# Patient Record
Sex: Female | Born: 1960
Health system: Southern US, Community
[De-identification: ages and names within clinical notes are randomized; demographics above are authoritative.]

## PROBLEM LIST (undated history)

## (undated) DIAGNOSIS — R12 Heartburn: Secondary | ICD-10-CM

## (undated) DIAGNOSIS — Z87440 Personal history of urinary (tract) infections: Secondary | ICD-10-CM

## (undated) DIAGNOSIS — I10 Essential (primary) hypertension: Secondary | ICD-10-CM

## (undated) DIAGNOSIS — M549 Dorsalgia, unspecified: Secondary | ICD-10-CM

## (undated) DIAGNOSIS — B019 Varicella without complication: Secondary | ICD-10-CM

## (undated) HISTORY — DX: Varicella without complication: B01.9

## (undated) HISTORY — DX: Essential (primary) hypertension: I10

## (undated) HISTORY — PX: TRANSVAGINAL TAPE (TVT) REMOVAL: SHX6154

## (undated) HISTORY — DX: Heartburn: R12

## (undated) HISTORY — DX: Dorsalgia, unspecified: M54.9

## (undated) HISTORY — DX: Personal history of urinary (tract) infections: Z87.440

## (undated) HISTORY — PX: CARPAL TUNNEL RELEASE: SHX101

---
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
## 1984-01-10 HISTORY — PX: TONSILLECTOMY: SUR1361

## 2012-05-17 LAB — HM COLONOSCOPY

## 2013-04-03 DIAGNOSIS — N952 Postmenopausal atrophic vaginitis: Secondary | ICD-10-CM | POA: Insufficient documentation

## 2013-04-03 DIAGNOSIS — E669 Obesity, unspecified: Secondary | ICD-10-CM | POA: Insufficient documentation

## 2015-05-24 DIAGNOSIS — B399 Histoplasmosis, unspecified: Secondary | ICD-10-CM | POA: Diagnosis not present

## 2015-06-28 ENCOUNTER — Ambulatory Visit (INDEPENDENT_AMBULATORY_CARE_PROVIDER_SITE_OTHER): Payer: BLUE CROSS/BLUE SHIELD | Admitting: Family Medicine

## 2015-06-28 ENCOUNTER — Encounter: Payer: Self-pay | Admitting: Family Medicine

## 2015-06-28 VITALS — BP 122/86 | HR 63 | Temp 98.2°F | Ht 66.5 in | Wt 232.8 lb

## 2015-06-28 DIAGNOSIS — R300 Dysuria: Secondary | ICD-10-CM | POA: Diagnosis not present

## 2015-06-28 DIAGNOSIS — N3001 Acute cystitis with hematuria: Secondary | ICD-10-CM | POA: Diagnosis not present

## 2015-06-28 DIAGNOSIS — R21 Rash and other nonspecific skin eruption: Secondary | ICD-10-CM | POA: Insufficient documentation

## 2015-06-28 DIAGNOSIS — N390 Urinary tract infection, site not specified: Secondary | ICD-10-CM | POA: Insufficient documentation

## 2015-06-28 LAB — POCT URINALYSIS DIPSTICK
Bilirubin, UA: NEGATIVE
Glucose, UA: NEGATIVE
Ketones, UA: NEGATIVE
Protein, UA: NEGATIVE
Spec Grav, UA: 1.02
Urobilinogen, UA: 0.2
pH, UA: 7

## 2015-06-28 MED ORDER — PREDNISONE 10 MG PO TABS: ORAL_TABLET | ORAL | Status: DC

## 2015-06-28 MED ORDER — CEPHALEXIN 500 MG PO CAPS: 500.0000 mg | ORAL_CAPSULE | Freq: Two times a day (BID) | ORAL | Status: DC

## 2015-06-28 NOTE — Patient Instructions (Signed)
Nice to meet you. You have a UTI. We will treat you with Keflex. We'll send your urine for a culture to ensure that there is infection. We will also place she on oral steroids for your rash. If you develop worsening rash, fevers, abdominal pain, or any new or changing symptoms please seek medical attention.

## 2015-06-28 NOTE — Progress Notes (Signed)
Patient ID: Anne BaltimoreBetty Kidney, female   DOB: 1960-07-06, 55 y.o.   MRN: [id number]  Marikay AlarEric Catharine Kettlewell, MD Phone: [phone]  Anne Ryan is a 55 y.o. female who presents today for new patient visit.  UTI: Patient notes several days of dysuria, frequency, and urgency. No abdominal pain. No fevers. No vaginal discharge. Feels that her typical UTIs. Gets to 2-4 UTIs ear.  Rash: Patient notes several days of rash on bilateral legs and arms and shoulder and hands. Notes it is worse on the left leg. Notes it occurred after she was outside working. Had a similar rash last year that they stated was poison sumac. States responded to steroid injection. Notes the rash itches. Note she did run a towel over it and there was a clear liquid they came out of the lesions. Notes no fevers, new detergents, new soaps, new medications, contacts with this, or recent travel  Active Ambulatory Problems    Diagnosis Date Noted  . UTI (urinary tract infection) 06/28/2015  . Rash and nonspecific skin eruption 06/28/2015   Resolved Ambulatory Problems    Diagnosis Date Noted  . No Resolved Ambulatory Problems   Past Medical History  Diagnosis Date  . History of recurrent UTIs   . Chickenpox     Family History  Problem Relation Age of Onset  . Alcoholism    . Hyperlipidemia    . Heart disease    . Hypertension    . Diabetes      Social History   Social History  . Marital Status: Married    Spouse Name: N/A  . Number of Children: N/A  . Years of Education: N/A   Occupational History  . Not on file.   Social History Main Topics  . Smoking status: Former Games developermoker  . Smokeless tobacco: Not on file  . Alcohol Use: No  . Drug Use: No  . Sexual Activity: Not on file   Other Topics Concern  . Not on file   Social History Narrative  . No narrative on file    ROS  General:  Negative for nexplained weight loss, fever Skin: Positive for rash, Negative for new or changing mole, sore that won't heal HEENT:  Negative for trouble hearing, trouble seeing, ringing in ears, mouth sores, hoarseness, change in voice, dysphagia. CV:  Negative for chest pain, dyspnea, edema, palpitations Resp: Negative for cough, dyspnea, hemoptysis GI: Negative for nausea, vomiting, diarrhea, constipation, abdominal pain, melena, hematochezia. GU: Positive for dysuria, urinary frequency, Negative for incontinence, urinary hesitance, hematuria, vaginal or penile discharge, polyuria, sexual difficulty, lumps in testicle or breasts MSK: Negative for muscle cramps or aches, joint pain or swelling Neuro: Negative for headaches, weakness, numbness, dizziness, passing out/fainting Psych: Negative for depression, anxiety, memory problems  Objective  Physical Exam Filed Vitals:   06/28/15 1519  BP: 122/86  Pulse: 63  Temp: 98.2 F (36.8 C)    BP Readings from Last 3 Encounters:  06/28/15 122/86   Wt Readings from Last 3 Encounters:  06/28/15 232 lb 12.8 oz (105.597 kg)    Physical Exam  Constitutional: She is well-developed, well-nourished, and in no distress.  HENT:  Head: Normocephalic and atraumatic.  Right Ear: External ear normal.  Left Ear: External ear normal.  Mouth/Throat: Oropharynx is clear and moist. No oropharyngeal exudate.  Eyes: Conjunctivae are normal. Pupils are equal, round, and reactive to light.  Neck: Neck supple.  Cardiovascular: Normal rate, regular rhythm and normal heart sounds.   Pulmonary/Chest: Effort normal and  breath sounds normal.  Abdominal: Soft. Bowel sounds are normal. She exhibits no distension. There is no tenderness. There is no rebound and no guarding.  Musculoskeletal: She exhibits no edema.  Lymphadenopathy:    She has no cervical adenopathy.  Neurological: She is alert. Gait normal.  Skin: Skin is warm and dry. She is not diaphoretic.  Scattered erythematous papular rash most notable on left lower extremity, some scattered papules on bilateral upper extremities and  right lower extremity, lesions or nontender, non-crusted, nonfluctuant, are fully blanchable, with no drainage  Psychiatric: Mood and affect normal.     Assessment/Plan:   UTI (urinary tract infection) UA and symptoms consistent with UTI. Benign abdominal exam. Stable vital signs. Treat with Keflex. Urine culture sent. Given return precautions.  Rash and nonspecific skin eruption Suspect allergic dermatitis from likely contact exposure, though appearance and lack of linear distribution is not classic for this. Does report this is consistent with her prior poison sumac exposure. Given distribution and wide spread nature we will treat with oral prednisone for 5 day taper. If not improving at that time would consider dermatology referral. Given return precautions.    Orders Placed This Encounter  Procedures  . Urine Culture  . POCT Urinalysis Dipstick    Marikay Alar, MD [facility]

## 2015-06-28 NOTE — Progress Notes (Signed)
Pre visit review using our clinic review tool, if applicable. No additional management support is needed unless otherwise documented below in the visit note. 

## 2015-06-28 NOTE — Assessment & Plan Note (Signed)
UA and symptoms consistent with UTI. Benign abdominal exam. Stable vital signs. Treat with Keflex. Urine culture sent. Given return precautions.

## 2015-06-28 NOTE — Assessment & Plan Note (Signed)
Suspect allergic dermatitis from likely contact exposure, though appearance and lack of linear distribution is not classic for this. Does report this is consistent with her prior poison sumac exposure. Given distribution and wide spread nature we will treat with oral prednisone for 5 day taper. If not improving at that time would consider dermatology referral. Given return precautions.

## 2015-06-30 LAB — URINE CULTURE: Colony Count: >=100000

## 2015-07-01 ENCOUNTER — Other Ambulatory Visit: Payer: Self-pay | Admitting: Family Medicine

## 2015-07-01 MED ORDER — NITROFURANTOIN MONOHYD MACRO 100 MG PO CAPS: 100.0000 mg | ORAL_CAPSULE | Freq: Two times a day (BID) | ORAL | Status: DC

## 2015-07-07 ENCOUNTER — Encounter: Payer: Self-pay | Admitting: Family Medicine

## 2015-07-09 ENCOUNTER — Other Ambulatory Visit: Payer: Self-pay | Admitting: Family Medicine

## 2015-07-09 DIAGNOSIS — R21 Rash and other nonspecific skin eruption: Secondary | ICD-10-CM

## 2015-07-13 DIAGNOSIS — R21 Rash and other nonspecific skin eruption: Secondary | ICD-10-CM | POA: Diagnosis not present

## 2015-07-21 DIAGNOSIS — L237 Allergic contact dermatitis due to plants, except food: Secondary | ICD-10-CM | POA: Diagnosis not present

## 2015-07-21 DIAGNOSIS — R21 Rash and other nonspecific skin eruption: Secondary | ICD-10-CM | POA: Diagnosis not present

## 2015-09-22 DIAGNOSIS — N3000 Acute cystitis without hematuria: Secondary | ICD-10-CM | POA: Diagnosis not present

## 2015-09-29 ENCOUNTER — Encounter: Payer: Self-pay | Admitting: Family Medicine

## 2015-09-29 ENCOUNTER — Ambulatory Visit (INDEPENDENT_AMBULATORY_CARE_PROVIDER_SITE_OTHER): Payer: BLUE CROSS/BLUE SHIELD | Admitting: Family Medicine

## 2015-09-29 VITALS — BP 114/80 | HR 76 | Temp 98.2°F | Wt 228.8 lb

## 2015-09-29 DIAGNOSIS — Z23 Encounter for immunization: Secondary | ICD-10-CM | POA: Diagnosis not present

## 2015-09-29 DIAGNOSIS — R300 Dysuria: Secondary | ICD-10-CM

## 2015-09-29 NOTE — Assessment & Plan Note (Signed)
Being treated by NP at work. On macrobid. Improving overall. We will request the culture results from the NP. She will complete the antibiotic course.

## 2015-09-29 NOTE — Progress Notes (Signed)
  Marikay AlarEric Raychel Dowler, MD Phone: [phone]  Anne BaltimoreBetty Ryan is a 55 y.o. female who presents today for f/u.  UTI: notes she has been on macrobid for 6 days. She was seen at her work clinic last week and they collected a urine. Notes her dysuria has been improving. Frequency has been improving. No abdominal pain or discharge. No fevers. Has 1 tablet of Macrobid left. Has been evaluated by urology in the past for frequent UTIs.  Saw dermatology for rash. They stated it was poison ivy. After she saw me last time she ended up going to urgent care and getting another dose pack of prednisone. No recurrent rash.  ROS see history of present illness  Objective  Physical Exam Vitals:   09/29/15 1358  BP: 114/80  Pulse: 76  Temp: 98.2 F (36.8 C)    BP Readings from Last 3 Encounters:  09/29/15 114/80  06/28/15 122/86   Wt Readings from Last 3 Encounters:  09/29/15 228 lb 12.8 oz (103.8 kg)  06/28/15 232 lb 12.8 oz (105.6 kg)    Physical Exam  Constitutional: No distress.  Cardiovascular: Normal rate, regular rhythm and normal heart sounds.   Pulmonary/Chest: Effort normal and breath sounds normal.  Abdominal: Soft. Bowel sounds are normal. She exhibits no distension. There is no tenderness. There is no rebound and no guarding.  Neurological: She is alert. Gait normal.  Skin: Skin is warm and dry. No rash noted. She is not diaphoretic.     Assessment/Plan: Please see individual problem list.  UTI (urinary tract infection) Being treated by NP at work. On macrobid. Improving overall. We will request the culture results from the NP. She will complete the antibiotic course.  Rash and nonspecific skin eruption No recurrence. She will monitor for recurrence.    Orders Placed This Encounter  Procedures  . Flu Vaccine QUAD 36+ mos IM   Marikay AlarEric Sunjai Levandoski, MD [facility]

## 2015-09-29 NOTE — Assessment & Plan Note (Signed)
No recurrence.  She will monitor for recurrence. 

## 2015-09-29 NOTE — Patient Instructions (Addendum)
Nice to see you. We will request the records for urine culture. These finished the Macrobid. If you develop recurrent symptoms after discontinuing the Macrobid please let us know.

## 2015-09-29 NOTE — Progress Notes (Signed)
Pre visit review using our clinic review tool, if applicable. No additional management support is needed unless otherwise documented below in the visit note. 

## 2015-10-25 DIAGNOSIS — M62830 Muscle spasm of back: Secondary | ICD-10-CM | POA: Diagnosis not present

## 2015-10-25 DIAGNOSIS — M9902 Segmental and somatic dysfunction of thoracic region: Secondary | ICD-10-CM | POA: Diagnosis not present

## 2015-10-25 DIAGNOSIS — M546 Pain in thoracic spine: Secondary | ICD-10-CM | POA: Diagnosis not present

## 2015-10-26 DIAGNOSIS — M9902 Segmental and somatic dysfunction of thoracic region: Secondary | ICD-10-CM | POA: Diagnosis not present

## 2015-10-26 DIAGNOSIS — M546 Pain in thoracic spine: Secondary | ICD-10-CM | POA: Diagnosis not present

## 2015-10-26 DIAGNOSIS — M62830 Muscle spasm of back: Secondary | ICD-10-CM | POA: Diagnosis not present

## 2015-10-28 DIAGNOSIS — M546 Pain in thoracic spine: Secondary | ICD-10-CM | POA: Diagnosis not present

## 2015-10-28 DIAGNOSIS — M9902 Segmental and somatic dysfunction of thoracic region: Secondary | ICD-10-CM | POA: Diagnosis not present

## 2015-10-28 DIAGNOSIS — M62830 Muscle spasm of back: Secondary | ICD-10-CM | POA: Diagnosis not present

## 2015-11-01 DIAGNOSIS — M546 Pain in thoracic spine: Secondary | ICD-10-CM | POA: Diagnosis not present

## 2015-11-01 DIAGNOSIS — M9902 Segmental and somatic dysfunction of thoracic region: Secondary | ICD-10-CM | POA: Diagnosis not present

## 2015-11-01 DIAGNOSIS — M62830 Muscle spasm of back: Secondary | ICD-10-CM | POA: Diagnosis not present

## 2015-11-03 DIAGNOSIS — M9902 Segmental and somatic dysfunction of thoracic region: Secondary | ICD-10-CM | POA: Diagnosis not present

## 2015-11-03 DIAGNOSIS — M546 Pain in thoracic spine: Secondary | ICD-10-CM | POA: Diagnosis not present

## 2015-11-03 DIAGNOSIS — M62830 Muscle spasm of back: Secondary | ICD-10-CM | POA: Diagnosis not present

## 2015-11-08 DIAGNOSIS — M546 Pain in thoracic spine: Secondary | ICD-10-CM | POA: Diagnosis not present

## 2015-11-08 DIAGNOSIS — M62830 Muscle spasm of back: Secondary | ICD-10-CM | POA: Diagnosis not present

## 2015-11-08 DIAGNOSIS — M9902 Segmental and somatic dysfunction of thoracic region: Secondary | ICD-10-CM | POA: Diagnosis not present

## 2015-12-01 DIAGNOSIS — Z1231 Encounter for screening mammogram for malignant neoplasm of breast: Secondary | ICD-10-CM | POA: Diagnosis not present

## 2015-12-01 LAB — HM MAMMOGRAPHY

## 2015-12-16 ENCOUNTER — Encounter: Payer: Self-pay | Admitting: Family Medicine

## 2016-01-31 ENCOUNTER — Telehealth: Payer: Self-pay | Admitting: Family Medicine

## 2016-01-31 NOTE — Telephone Encounter (Signed)
I called pt responding to her Mychart msg. Pt would like  Hepatitis C Screening  Pap Smear    Comments:  Need to schedule my annual exam   Thank you!

## 2016-03-28 ENCOUNTER — Other Ambulatory Visit (HOSPITAL_COMMUNITY)
Admission: RE | Admit: 2016-03-28 | Discharge: 2016-03-28 | Disposition: A | Discharge status: Home / Self Care | Payer: BLUE CROSS/BLUE SHIELD | Source: Ambulatory Visit | Attending: Family Medicine | Admitting: Family Medicine

## 2016-03-28 ENCOUNTER — Encounter: Payer: Self-pay | Admitting: Family Medicine

## 2016-03-28 ENCOUNTER — Ambulatory Visit (INDEPENDENT_AMBULATORY_CARE_PROVIDER_SITE_OTHER): Payer: BLUE CROSS/BLUE SHIELD | Admitting: Family Medicine

## 2016-03-28 VITALS — BP 132/90 | HR 62 | Temp 97.7°F | Ht 66.5 in | Wt 231.0 lb

## 2016-03-28 DIAGNOSIS — E669 Obesity, unspecified: Secondary | ICD-10-CM

## 2016-03-28 DIAGNOSIS — Z0001 Encounter for general adult medical examination with abnormal findings: Secondary | ICD-10-CM | POA: Insufficient documentation

## 2016-03-28 DIAGNOSIS — Z124 Encounter for screening for malignant neoplasm of cervix: Secondary | ICD-10-CM

## 2016-03-28 DIAGNOSIS — Z1159 Encounter for screening for other viral diseases: Secondary | ICD-10-CM | POA: Diagnosis not present

## 2016-03-28 DIAGNOSIS — Z13220 Encounter for screening for lipoid disorders: Secondary | ICD-10-CM

## 2016-03-28 DIAGNOSIS — L989 Disorder of the skin and subcutaneous tissue, unspecified: Secondary | ICD-10-CM | POA: Diagnosis not present

## 2016-03-28 DIAGNOSIS — Z0000 Encounter for general adult medical examination without abnormal findings: Secondary | ICD-10-CM

## 2016-03-28 DIAGNOSIS — Z130 Encounter for screening for diseases of the blood and blood-forming organs and certain disorders involving the immune mechanism: Secondary | ICD-10-CM | POA: Diagnosis not present

## 2016-03-28 DIAGNOSIS — Z1329 Encounter for screening for other suspected endocrine disorder: Secondary | ICD-10-CM

## 2016-03-28 NOTE — Progress Notes (Addendum)
Anne Rumps, MD Phone: [phone]  Tillie Viverette is a 56 y.o. female who presents today for physical exam  Patient tries to exercise by walking for about 30 minutes most days of the week if time permits at work. She is trying to do Weight Watchers type dieting. She stays away from fried foods and does drink some diet soda. She is due for Pap smear. Reports colonoscopy in 2014. Mammogram last year. Have menstrual cycles anymore. Tetanus vaccination 2012. Flu shot up-to-date. Declines HIV testing. She will check on hepatitis C testing with her insurance.  Active Ambulatory Problems    Diagnosis Date Noted  . UTI (urinary tract infection) 06/28/2015  . Rash and nonspecific skin eruption 06/28/2015  . Routine general medical examination at a health care facility 03/28/2016  . Skin lesion on examination 03/28/2016   Resolved Ambulatory Problems    Diagnosis Date Noted  . No Resolved Ambulatory Problems   Past Medical History:  Diagnosis Date  . Chickenpox   . History of recurrent UTIs     Family History  Problem Relation Age of Onset  . Alcoholism    . Hyperlipidemia    . Heart disease    . Hypertension    . Diabetes      Social History   Social History  . Marital status: Married    Spouse name: N/A  . Number of children: N/A  . Years of education: N/A   Occupational History  . Not on file.   Social History Main Topics  . Smoking status: Former Research scientist (life sciences)  . Smokeless tobacco: Never Used  . Alcohol use No  . Drug use: No  . Sexual activity: Not on file   Other Topics Concern  . Not on file   Social History Narrative  . No narrative on file    ROS  General:  Negative for nexplained weight loss, fever Skin: Negative for new or changing mole, sore that won't heal HEENT: Negative for trouble hearing, trouble seeing, ringing in ears, mouth sores, hoarseness, change in voice, dysphagia. CV:  Negative for chest pain, dyspnea, edema, palpitations Resp:  Negative for cough, dyspnea, hemoptysis GI: Negative for nausea, vomiting, diarrhea, constipation, abdominal pain, melena, hematochezia. GU: Negative for dysuria, incontinence, urinary hesitance, hematuria, vaginal or penile discharge, polyuria, sexual difficulty, lumps in testicle or breasts MSK: Negative for muscle cramps or aches, joint pain or swelling Neuro: Negative for headaches, weakness, numbness, dizziness, passing out/fainting Psych: Negative for depression, anxiety, memory problems  Objective  Physical Exam Vitals:   03/28/16 1512  BP: 132/90  Pulse: 62  Temp: 97.7 F (36.5 C)    BP Readings from Last 3 Encounters:  03/28/16 132/90  09/29/15 114/80  06/28/15 122/86   Wt Readings from Last 3 Encounters:  03/28/16 231 lb (104.8 kg)  09/29/15 228 lb 12.8 oz (103.8 kg)  06/28/15 232 lb 12.8 oz (105.6 kg)    Physical Exam  Constitutional: No distress.  HENT:  Head: Normocephalic and atraumatic.  Mouth/Throat: Oropharynx is clear and moist. No oropharyngeal exudate.  Eyes: Conjunctivae are normal. Pupils are equal, round, and reactive to light.  Cardiovascular: Normal rate, regular rhythm and normal heart sounds.   Pulmonary/Chest: Effort normal and breath sounds normal.  Abdominal: Soft. Bowel sounds are normal. She exhibits no distension. There is no tenderness. There is no rebound and no guarding.  Genitourinary:  Genitourinary Comments: Normal labia, normal vaginal mucosa, normal cervix, normal bimanual exam, there appeared to be a possible skin tag in  the right upper suprapubic region that is nontender, does not appear consistent with a wart  Musculoskeletal: She exhibits no edema.  Neurological: She is alert. Gait normal.  Skin: Skin is warm and dry. She is not diaphoretic.  Psychiatric: Mood and affect normal.     Assessment/Plan:   Routine general medical examination at a health care facility Physical exam completed. Advised on diet and exercise. Pap  smear completed. We'll request colonoscopy report. She declines HIV testing. We'll check hepatitis C test.  Skin lesion on examination Suspect skin tag. We'll refer to dermatology to consider removal.   BP borderline elevated. We will have her return in 2 weeks for recheck.  Orders Placed This Encounter  Procedures  . Comp Met (CMET)  . Lipid Profile  . HgB A1c  . CBC  . TSH  . Hepatitis C Antibody  . Ambulatory referral to Dermatology    Referral Priority:   Routine    Referral Type:   Consultation    Referral Reason:   Specialty Services Required    Requested Specialty:   Dermatology    Number of Visits Requested:   1    No orders of the defined types were placed in this encounter.    Anne Rumps, MD [place]

## 2016-03-28 NOTE — Assessment & Plan Note (Signed)
Suspect skin tag. We'll refer to dermatology to consider removal.

## 2016-03-28 NOTE — Progress Notes (Signed)
Pre visit review using our clinic review tool, if applicable. No additional management support is needed unless otherwise documented below in the visit note. 

## 2016-03-28 NOTE — Patient Instructions (Signed)
Nice to see you. We'll refer you to dermatology. We'll get some lab work and contact you with the results. Please work on diet and exercise.

## 2016-03-28 NOTE — Assessment & Plan Note (Signed)
Physical exam completed. Advised on diet and exercise. Pap smear completed. We'll request colonoscopy report. She declines HIV testing. We'll check hepatitis C test.

## 2016-03-29 ENCOUNTER — Other Ambulatory Visit (INDEPENDENT_AMBULATORY_CARE_PROVIDER_SITE_OTHER): Payer: BLUE CROSS/BLUE SHIELD

## 2016-03-29 ENCOUNTER — Other Ambulatory Visit: Payer: Self-pay | Admitting: Radiology

## 2016-03-29 DIAGNOSIS — Z1329 Encounter for screening for other suspected endocrine disorder: Secondary | ICD-10-CM

## 2016-03-29 LAB — LIPID PANEL
Cholesterol: 182 mg/dL (ref 0–200)
HDL: 40.5 mg/dL (ref >39.00)
LDL Cholesterol: 119 mg/dL — ABNORMAL HIGH (ref 0–99)
NonHDL: 141.1
Total CHOL/HDL Ratio: 4
Triglycerides: 109 mg/dL (ref 0.0–149.0)
VLDL: 21.8 mg/dL (ref 0.0–40.0)

## 2016-03-29 LAB — CBC
HCT: 40.3 % (ref 36.0–46.0)
Hemoglobin: 13.8 g/dL (ref 12.0–15.0)
MCHC: 34.3 g/dL (ref 30.0–36.0)
MCV: 85.9 fl (ref 78.0–100.0)
Platelets: 280 10*3/uL (ref 150.0–400.0)
RBC: 4.69 Mil/uL (ref 3.87–5.11)
RDW: 13.7 % (ref 11.5–15.5)
WBC: 5.2 10*3/uL (ref 4.0–10.5)

## 2016-03-29 LAB — COMPREHENSIVE METABOLIC PANEL
ALT: 22 U/L (ref 0–35)
AST: 21 U/L (ref 0–37)
Albumin: 4.6 g/dL (ref 3.5–5.2)
Alkaline Phosphatase: 73 U/L (ref 39–117)
BUN: 21 mg/dL (ref 6–23)
CO2: 30 mEq/L (ref 19–32)
Calcium: 9.9 mg/dL (ref 8.4–10.5)
Chloride: 105 mEq/L (ref 96–112)
Creatinine, Ser: 0.83 mg/dL (ref 0.40–1.20)
GFR: 76.08 mL/min (ref >60.00)
Glucose, Bld: 85 mg/dL (ref 70–99)
Potassium: 4.4 mEq/L (ref 3.5–5.1)
Sodium: 141 mEq/L (ref 135–145)
Total Bilirubin: 0.9 mg/dL (ref 0.2–1.2)
Total Protein: 7.3 g/dL (ref 6.0–8.3)

## 2016-03-29 LAB — T4, FREE: Free T4: 0.78 ng/dL (ref 0.60–1.60)

## 2016-03-29 LAB — TSH: TSH: 4.92 u[IU]/mL — ABNORMAL HIGH (ref 0.35–4.50)

## 2016-03-29 LAB — T3, FREE: T3, Free: 4 pg/mL (ref 2.3–4.2)

## 2016-03-29 LAB — HEPATITIS C ANTIBODY: HCV Ab: NEGATIVE

## 2016-03-29 LAB — HEMOGLOBIN A1C: Hgb A1c MFr Bld: 5.5 % (ref 4.6–6.5)

## 2016-03-30 ENCOUNTER — Telehealth: Payer: Self-pay

## 2016-03-30 LAB — CYTOLOGY - PAP
Diagnosis: NEGATIVE
HPV: NOT DETECTED

## 2016-03-30 NOTE — Telephone Encounter (Signed)
Patient advised of below and verbalized an understanding.  Appointment scheduled for lab work in 6 weeks.

## 2016-03-30 NOTE — Telephone Encounter (Signed)
Left message to return call 

## 2016-03-30 NOTE — Telephone Encounter (Signed)
-----   Message from Glori LuisEric G Sonnenberg, MD sent at 03/29/2016  7:02 PM EDT ----- Please let the patient know that her LDL cholesterol is slightly elevated. She is working on diet and exercise for this. Her TSH was minimally elevated though her thyroid hormones were normal. I would like to recheck her TSH in about 6 weeks. Her other lab work is acceptable. Thanks.

## 2016-04-04 ENCOUNTER — Encounter: Payer: Self-pay | Admitting: Family Medicine

## 2016-04-11 ENCOUNTER — Encounter: Payer: Self-pay | Admitting: *Deleted

## 2016-04-11 ENCOUNTER — Ambulatory Visit (INDEPENDENT_AMBULATORY_CARE_PROVIDER_SITE_OTHER): Payer: BLUE CROSS/BLUE SHIELD | Admitting: *Deleted

## 2016-04-11 VITALS — BP 136/92 | HR 59 | Resp 14

## 2016-04-11 DIAGNOSIS — Z0130 Encounter for examination of blood pressure without abnormal findings: Secondary | ICD-10-CM

## 2016-04-11 NOTE — Progress Notes (Signed)
Patient presented for 2 week re-check on BP  OV was 03/28/16 BP at that visit 132/90. Patient BP was attained according to guidelines  In left arm 130/9/ pulse 58 , then BP was attained in right arm 136/92 pulse 59.

## 2016-04-13 NOTE — Progress Notes (Signed)
Blood pressure remains slightly elevated. I would advise starting on medication for this. If she is willing to do this I can send in amlodipine to her pharmacy.  Marikay Alar, M.D.

## 2016-04-13 NOTE — Progress Notes (Signed)
Left message for patient to return call to office. 

## 2016-04-14 ENCOUNTER — Telehealth: Payer: Self-pay | Admitting: Family Medicine

## 2016-04-14 MED ORDER — AMLODIPINE BESYLATE 5 MG PO TABS: 5.0000 mg | ORAL_TABLET | Freq: Every day | ORAL | 3 refills | Status: DC

## 2016-04-14 NOTE — Addendum Note (Signed)
Addended by: Glori Luis on: 04/14/2016 06:04 PM   Modules accepted: Orders

## 2016-04-14 NOTE — Telephone Encounter (Signed)
Pled back returning your call. Will be available after 10:30. Please advise, thank you!  Call pt @ [phone]

## 2016-04-14 NOTE — Progress Notes (Signed)
Noted. Amlodipine sent to pharmacy.

## 2016-04-14 NOTE — Progress Notes (Signed)
Patient notified and voiced understanding, patient has concerns that she does not want to stay on medicatin permanently but is will to take medication until she discuss with PCP how to lower pressures sand come off medication with diet , exercise. Patient would like call ed to [facility] [address] and will discuss other alternatives at next visit with PCP.

## 2016-04-14 NOTE — Telephone Encounter (Signed)
See clinical support note

## 2016-05-11 ENCOUNTER — Telehealth: Payer: Self-pay | Admitting: Radiology

## 2016-05-11 DIAGNOSIS — R7989 Other specified abnormal findings of blood chemistry: Secondary | ICD-10-CM

## 2016-05-11 NOTE — Telephone Encounter (Signed)
Pt coming in for labs tomorrow, please place future orders, thank you.  

## 2016-05-11 NOTE — Addendum Note (Signed)
Addended by: Birdie SonsSONNENBERG, Iyonnah Ferrante G on: 05/11/2016 11:49 AM   Modules accepted: Orders

## 2016-05-12 ENCOUNTER — Other Ambulatory Visit (INDEPENDENT_AMBULATORY_CARE_PROVIDER_SITE_OTHER): Payer: BLUE CROSS/BLUE SHIELD

## 2016-05-12 DIAGNOSIS — R7989 Other specified abnormal findings of blood chemistry: Secondary | ICD-10-CM

## 2016-05-12 DIAGNOSIS — R946 Abnormal results of thyroid function studies: Secondary | ICD-10-CM | POA: Diagnosis not present

## 2016-05-12 LAB — T4, FREE: Free T4: 0.68 ng/dL (ref 0.60–1.60)

## 2016-05-12 LAB — TSH: TSH: 4.31 u[IU]/mL (ref 0.35–4.50)

## 2016-06-13 ENCOUNTER — Telehealth: Payer: Self-pay | Admitting: Family Medicine

## 2016-06-13 DIAGNOSIS — L255 Unspecified contact dermatitis due to plants, except food: Secondary | ICD-10-CM | POA: Diagnosis not present

## 2016-06-13 NOTE — Telephone Encounter (Signed)
Called patient back and advised to go to urgent care for evaluation since rash  is  to eye and all over leg going on since yesterday breaking out around eye since today.  Patient in agreement of going to urgent care.

## 2016-06-13 NOTE — Telephone Encounter (Signed)
Pt called and stated that she started breaking out with poison ivy today. She states that it is in her rt eye. Can we call something in for her. Please advise, thank you!  Call pt @ [phone]  [facility] - [facility] [address] - [place], [place] - [id number]

## 2016-06-13 NOTE — Telephone Encounter (Signed)
Agree with evaluation today.  

## 2016-07-31 DIAGNOSIS — D280 Benign neoplasm of vulva: Secondary | ICD-10-CM | POA: Diagnosis not present

## 2016-07-31 DIAGNOSIS — D485 Neoplasm of uncertain behavior of skin: Secondary | ICD-10-CM | POA: Diagnosis not present

## 2016-09-07 DIAGNOSIS — L0390 Cellulitis, unspecified: Secondary | ICD-10-CM | POA: Diagnosis not present

## 2016-09-07 DIAGNOSIS — S70361A Insect bite (nonvenomous), right thigh, initial encounter: Secondary | ICD-10-CM | POA: Diagnosis not present

## 2016-09-18 DIAGNOSIS — N3000 Acute cystitis without hematuria: Secondary | ICD-10-CM | POA: Diagnosis not present

## 2016-09-23 DIAGNOSIS — N390 Urinary tract infection, site not specified: Secondary | ICD-10-CM | POA: Diagnosis not present

## 2016-09-23 DIAGNOSIS — R300 Dysuria: Secondary | ICD-10-CM | POA: Diagnosis not present

## 2016-10-24 ENCOUNTER — Ambulatory Visit (INDEPENDENT_AMBULATORY_CARE_PROVIDER_SITE_OTHER): Payer: BLUE CROSS/BLUE SHIELD | Admitting: *Deleted

## 2016-10-24 DIAGNOSIS — Z23 Encounter for immunization: Secondary | ICD-10-CM | POA: Diagnosis not present

## 2016-11-09 DIAGNOSIS — N3090 Cystitis, unspecified without hematuria: Secondary | ICD-10-CM | POA: Diagnosis not present

## 2016-11-20 DIAGNOSIS — N3001 Acute cystitis with hematuria: Secondary | ICD-10-CM | POA: Diagnosis not present

## 2016-11-20 DIAGNOSIS — R300 Dysuria: Secondary | ICD-10-CM | POA: Diagnosis not present

## 2016-12-21 DIAGNOSIS — Z1231 Encounter for screening mammogram for malignant neoplasm of breast: Secondary | ICD-10-CM | POA: Diagnosis not present

## 2016-12-21 DIAGNOSIS — R82998 Other abnormal findings in urine: Secondary | ICD-10-CM | POA: Diagnosis not present

## 2016-12-21 LAB — HM MAMMOGRAPHY

## 2016-12-26 ENCOUNTER — Encounter: Payer: Self-pay | Admitting: Family Medicine

## 2017-01-16 DIAGNOSIS — N390 Urinary tract infection, site not specified: Secondary | ICD-10-CM | POA: Diagnosis not present

## 2017-01-16 DIAGNOSIS — N3001 Acute cystitis with hematuria: Secondary | ICD-10-CM | POA: Diagnosis not present

## 2017-03-08 ENCOUNTER — Ambulatory Visit (INDEPENDENT_AMBULATORY_CARE_PROVIDER_SITE_OTHER): Payer: BLUE CROSS/BLUE SHIELD | Admitting: Urology

## 2017-03-08 ENCOUNTER — Encounter: Payer: Self-pay | Admitting: Urology

## 2017-03-08 VITALS — BP 128/84 | HR 72 | Resp 16 | Ht 67.0 in | Wt 243.7 lb

## 2017-03-08 DIAGNOSIS — N390 Urinary tract infection, site not specified: Secondary | ICD-10-CM | POA: Diagnosis not present

## 2017-03-08 DIAGNOSIS — N952 Postmenopausal atrophic vaginitis: Secondary | ICD-10-CM

## 2017-03-08 LAB — URINALYSIS, COMPLETE
Bilirubin, UA: NEGATIVE
Glucose, UA: NEGATIVE
Ketones, UA: NEGATIVE
Nitrite, UA: POSITIVE — AB
Specific Gravity, UA: 1.02 (ref 1.005–1.030)
Urobilinogen, Ur: 0.2 mg/dL (ref 0.2–1.0)
pH, UA: 7 (ref 5.0–7.5)

## 2017-03-08 LAB — MICROSCOPIC EXAMINATION: WBC, UA: >30 /hpf — ABNORMAL HIGH (ref 0–?)

## 2017-03-08 LAB — BLADDER SCAN AMB NON-IMAGING: Scan Result: 238

## 2017-03-08 MED ORDER — ESTRADIOL 0.1 MG/GM VA CREA
TOPICAL_CREAM | VAGINAL | 12 refills | Status: DC
Start: 2017-03-08 — End: 2017-04-06

## 2017-03-08 MED ORDER — NITROFURANTOIN MONOHYD MACRO 100 MG PO CAPS: 100.0000 mg | ORAL_CAPSULE | Freq: Two times a day (BID) | ORAL | 0 refills | Status: DC

## 2017-03-08 NOTE — Progress Notes (Signed)
03/08/2017 11:41 AM   Anne Ryan July 27, 1960 [id number]  Referring provider: Leone Haven, MD [address] [place], [place] [address]  No chief complaint on file.   HPI: Patient is a 57 -year-old Caucasian female who is referred to [place] by, Dr. Caryl Bis, for recurrent urinary tract infections.  Patient states that she has been dealing with recurrent UTI's since she moved her from [place] in 2015.  She was being followed at [facility] at that time.  She had three documented positive urine cultures with them.  One with Kleb and two with E. Coli.  Both organisms with a variable resistance pattern.  She states they ran all the tests and did not find any problems.  Reviewing her records she had a RUS which was normal and a bladder scan which noted a PVR of 46 mL.  She was started on Premarin cream with them.  She has since stopped using the cream.  More recently, she has had three positive urine cultures for E. Coli with a variable resistance pattern and two with Group B Strep.    Her symptoms with a urinary tract infection consist of frequency, dysuria and suprapubic pain and cramping.   She denies gross hematuria, back pain, abdominal pain or flank pain with UTI's.  She has not had any recent fevers, chills, nausea or vomiting.   She states that she had several bladder surgeries as a child for reflux.  She also had the placement of a TVT and removal in the 90's.    She is sexually active.  She has not noted a correlation with her urinary tract infections and sexual intercourse.    She does not engage in anal sex.    She is voiding before and after sex.   She is postmenopausal.     She denies constipation and/or diarrhea.   She does engage in good perineal hygiene. She does not take tub baths.   She does not have incontinence.  She is using incontinence pads daily just in case.    She is not having pain with bladder filling.    She has not had any recent  imaging studies.    She is drinking 40 ounces of water daily.   One diet Pepsi daily.  No coffee.  No juices.  Social alcohol.     She has baseline nocturia and leakage.  Her PVR today is 238 mL.  Her UA today is nitrite positive, with many bacteria and > 30 WBC's.  She feels she is getting an UTI at today's visit.      PMH: Past Medical History:  Diagnosis Date  . Chickenpox   . History of recurrent UTIs     Surgical History: Past Surgical History:  Procedure Laterality Date  . BLADDER SURGERY  1968  . CARPAL TUNNEL RELEASE    . TONSILLECTOMY  1986  . TRANSVAGINAL TAPE (TVT) REMOVAL      Home Medications:  Allergies as of 03/08/2017      Reactions   Methenamine Rash   Sulfa Antibiotics Rash      Medication List        Accurate as of 03/08/17 11:41 AM. Always use your most recent med list.          amLODipine 5 MG tablet Commonly known as:  NORVASC Take 1 tablet (5 mg total) by mouth daily.   estradiol 0.1 MG/GM vaginal cream Commonly known as:  ESTRACE Apply a pea-sized amount to  the external vagina on Monday, Wednesday and Friday nights   Fish Oil 1000 MG Caps Take by mouth.   nitrofurantoin (macrocrystal-monohydrate) 100 MG capsule Commonly known as:  MACROBID Take 1 capsule (100 mg total) by mouth every 12 (twelve) hours.   THERA Tabs Take by mouth.   VITAMIN B 12 PO Take by mouth.       Allergies:  Allergies  Allergen Reactions  . Methenamine Rash  . Sulfa Antibiotics Rash    Family History: Family History  Problem Relation Age of Onset  . Brain cancer Mother   . Heart disease Mother   . Hyperlipidemia Mother   . Hypertension Mother   . Diabetes Mother   . Cirrhosis Father   . Alcoholism Father   . Diabetes Sister   . Hypertension Sister   . Sleep apnea Sister     Social History:  reports that she has quit smoking. she has never used smokeless tobacco. She reports that she does not drink alcohol or use  drugs.  ROS: UROLOGY Frequent Urination?: No Hard to postpone urination?: No Burning/pain with urination?: No Get up at night to urinate?: Yes Leakage of urine?: Yes Urine stream starts and stops?: No Trouble starting stream?: No Do you have to strain to urinate?: No Blood in urine?: No Urinary tract infection?: Yes Sexually transmitted disease?: No Injury to kidneys or bladder?: No Painful intercourse?: No Weak stream?: No Currently pregnant?: No Vaginal bleeding?: No Last menstrual period?: 2012  Gastrointestinal Nausea?: No Vomiting?: No Indigestion/heartburn?: No Diarrhea?: No Constipation?: No  Constitutional Fever: No Night sweats?: No Weight loss?: No Fatigue?: No  Skin Skin rash/lesions?: No Itching?: No  Eyes Blurred vision?: No Double vision?: No  Ears/Nose/Throat Sore throat?: No Sinus problems?: No  Hematologic/Lymphatic Swollen glands?: No Easy bruising?: No  Cardiovascular Leg swelling?: No Chest pain?: No  Respiratory Cough?: No Shortness of breath?: No  Endocrine Excessive thirst?: No  Musculoskeletal Back pain?: No Joint pain?: No  Neurological Headaches?: No Dizziness?: No  Psychologic Depression?: No Anxiety?: No  Physical Exam: BP 128/84   Pulse 72   Resp 16   Ht '5\' 7"'$  (1.702 m)   Wt 243 lb 11.2 oz (110.5 kg)   SpO2 97%   BMI 38.17 kg/m   Constitutional: Well nourished. Alert and oriented, No acute distress. HEENT: [place] AT, moist mucus membranes. Trachea midline, no masses. Cardiovascular: No clubbing, cyanosis, or edema. Respiratory: Normal respiratory effort, no increased work of breathing. GI: Abdomen is soft, non tender, non distended, no abdominal masses. Liver and spleen not palpable.  No hernias appreciated.  Stool sample for occult testing is not indicated.   GU: No CVA tenderness.  No bladder fullness or masses.  Atrophic external genitalia, normal pubic hair distribution, no lesions.  Normal urethral  meatus, no lesions, no prolapse, no discharge.   No urethral masses, tenderness and/or tenderness. No bladder fullness, tenderness or masses. Pale vagina mucosa, poor estrogen effect, no discharge, no lesions, good pelvic support, no Grade 1 cystocele.  Rectocele noted.  No cervical motion tenderness.  Uterus is freely mobile and non-fixed.  No adnexal/parametria masses or tenderness noted.  Anus and perineum are without rashes or lesions.    Skin: No rashes, bruises or suspicious lesions. Lymph: No cervical or inguinal adenopathy. Neurologic: Grossly intact, no focal deficits, moving all 4 extremities. Psychiatric: Normal mood and affect.  Laboratory Data: Lab Results  Component Value Date   WBC 5.2 03/28/2016   HGB 13.8 03/28/2016  HCT 40.3 03/28/2016   MCV 85.9 03/28/2016   PLT 280.0 03/28/2016    Lab Results  Component Value Date   CREATININE 0.83 03/28/2016    No results found for: PSA  No results found for: TESTOSTERONE  Lab Results  Component Value Date   HGBA1C 5.5 03/28/2016    Lab Results  Component Value Date   TSH 4.31 05/12/2016       Component Value Date/Time   CHOL 182 03/28/2016 1551   HDL 40.50 03/28/2016 1551   CHOLHDL 4 03/28/2016 1551   VLDL 21.8 03/28/2016 1551   LDLCALC 119 (H) 03/28/2016 1551    Lab Results  Component Value Date   AST 21 03/28/2016   Lab Results  Component Value Date   ALT 22 03/28/2016   No components found for: ALKALINEPHOPHATASE No components found for: BILIRUBINTOTAL  No results found for: ESTRADIOL  Urinalysis Nitrite positive.  Many bacteria. > 30 WBC's.  See Epic.     I have reviewed the labs.   Pertinent Imaging: Results for AERIAL, DILLEY (MRN [id number]) as of 03/13/2017 16:53  Ref. Range 03/08/2017 11:08  Scan Result Unknown [id number]     Assessment & Plan:    1. Recurrent UTI's  - criteria for recurrent UTI has been met with 2 or more infections in 6 months or 3 or greater infections in one year   -  patient is instructed to increase their water intake until the urine is pale yellow or clear (10 to 12 cups daily)   - patient is instructed to take probiotics (yogurt, oral pills or vaginal suppositories), take cranberry pills or drink the juice and Vitamin C 1,000 mg daily to acidify the urine   - avoid soaking in tubs and wipe front to back after urinating   - advised them to have CATH UA's for urinalysis and culture to prevent skin, vaginal and/or rectal contamination of the specimen in the future  - reviewed symptoms of UTI (fevers, chills, gross hematuria, mental status changes, dysuria, suprapubic pain, back pain and/or sudden worsening of urinary symptoms) and advised not to have urine checked or be treated for UTI if not experiencing symptoms  - discussed antibiotic stewardship with the patient - explained the risk of increasing risk of antibiotic resistance with continuous exposure to antibiotics, renal failure, hypoglycemia, C. Diff infection, allergic reactions, etc.                                   - Urinalysis, Complete - suspicious for infection - patient symptomatic - start nitrofurantoin empirically- will adjust once sensitivities are available  - urine culture  - Bladder Scan (Post Void Residual) in office  2. Vaginal atrophy -I explained to the patient that when women go through menopause and her estrogen levels are severely diminished, the normal vaginal flora will change.  This is due to an increase of the vaginal canal's pH. Because of this, the vaginal canal may be colonized by bacteria from the rectum instead of the protective lactobacillus.  This, accompanied by the loss of the mucus barrier with vaginal atrophy, is a cause of recurrent urinary tract infections. -In some studies, the use of vaginal estrogen cream has been demonstrated to reduce  recurrent urinary tract infections to one a year.  -Patient was given a sample of vaginal estrogen cream (Premarin vaginal cream) and  instructed to apply 0.'5mg'$  (pea-sized amount)  just inside the  vaginal introitus with a finger-tip on Monday, Wednesday and Friday nights.  I explained to the patient that vaginally administered estrogen, which causes only a slight increase in the blood estrogen levels, have fewer contraindications and adverse systemic effects that oral HT. -I have also given prescriptions for the Estrace cream  -She will follow up in three months for an exam.    Return in about 1 month (around 04/05/2017) for PVR and OAB questionnaire.  These notes generated with voice recognition software. I apologize for typographical errors.  Zara Council, [facility] [address], [place], [place] [address] [phone]

## 2017-03-08 NOTE — Patient Instructions (Signed)
                                             Urinary Tract Infection Prevention Patient Education Stay Hydrated: Urinary tract infections (UTIs) are less likely to occur in someone who is drinking enough water to promote regular urination, so it is very important to stay hydrated in order to help flush out bacteria from the urinary tract. Respond to "Nature's Call": It is always a good idea to urinate as soon as you feel the need. While "holding it in" does not directly cause an infection, it can cause overdistension that can damage the lining of the bladder, making it more vulnerable to bacteria. Remove Tampons Before Going: Remember to always take out tampons before urinating, and change tampons often.  Practice Proper Bathroom Hygiene: To keep bacteria near the urethral opening to a minimum, it is important to practice proper wiping techniques (i.e. front to back wiping) to help prevent rectal bacteria from entering the uretro-genital area. It can also be helpful to take showers and avoid soaking in the bathtub.  Take a Vitamin C Supplement: About 1,000 milligrams of vitamin C taken daily can help inhibit the growth of some bacteria by acidifying the urine. Maintain Control with Cranberries: Cranberries contain hippuronic acid, which is a natural antiseptic that may help prevent the adherence of bacteria to the bladder lining. Drinking 100% pure cranberry juice or taking over the counter cranberry supplements twice daily may help to prevent an infection. However, it is important to note that cranberry juices/supplements are not helpful once a urinary tract infection (UTI) is present. Strengthen Your Core: Often, a lazy bladder (unable to empty urine properly) occurs due to lower back problem, so consider doing exercises to help strengthen your back, pelvic floor, and stomach muscles. D[place] Pay Attention to Your Urine: Your urine can change color for a variety of reasons, including from the  medications you take, so pay close attention to it to monitor your overall health. One key thing to note is that if your urine is typically a darker yellow, your body is dehydrated, so you need to step up your water intake.    

## 2017-03-12 ENCOUNTER — Telehealth: Payer: Self-pay

## 2017-03-12 LAB — CULTURE, URINE COMPREHENSIVE

## 2017-03-12 NOTE — Telephone Encounter (Signed)
-----   Message from Harle BattiestShannon A McGowan, PA-C sent at 03/12/2017  7:24 AM EST ----- Please let Anne Ryan know that her urine culture is positive.  The E.coli is sensitive to the nitrofurantoin.

## 2017-03-12 NOTE — Telephone Encounter (Signed)
LMOM- +ucx and on correct abx.  

## 2017-03-19 ENCOUNTER — Other Ambulatory Visit: Payer: Self-pay | Admitting: Urology

## 2017-03-19 ENCOUNTER — Encounter: Payer: Self-pay | Admitting: Urology

## 2017-03-20 ENCOUNTER — Encounter: Payer: Self-pay | Admitting: Family Medicine

## 2017-03-20 MED ORDER — AMLODIPINE BESYLATE 5 MG PO TABS: 5.0000 mg | ORAL_TABLET | Freq: Every day | ORAL | 0 refills | Status: DC

## 2017-03-23 ENCOUNTER — Ambulatory Visit (INDEPENDENT_AMBULATORY_CARE_PROVIDER_SITE_OTHER): Payer: BLUE CROSS/BLUE SHIELD

## 2017-03-23 VITALS — BP 132/82 | HR 67 | Ht 67.0 in | Wt 243.0 lb

## 2017-03-23 DIAGNOSIS — N390 Urinary tract infection, site not specified: Secondary | ICD-10-CM

## 2017-03-23 LAB — URINALYSIS, COMPLETE
Bilirubin, UA: NEGATIVE
Glucose, UA: NEGATIVE
Ketones, UA: NEGATIVE
Nitrite, UA: POSITIVE — AB
Protein, UA: NEGATIVE
Specific Gravity, UA: 1.02 (ref 1.005–1.030)
Urobilinogen, Ur: 0.2 mg/dL (ref 0.2–1.0)
pH, UA: 6 (ref 5.0–7.5)

## 2017-03-23 LAB — MICROSCOPIC EXAMINATION

## 2017-03-23 NOTE — Progress Notes (Signed)
Pt presents today with c/o urinary frequency, urgency, dysuria, and leakage of urine. A cath specimen was obtained for u/a and cx.  Pt has been on abx in the last 30 days for a UTI.   Blood pressure 132/82, pulse 67, height 5\' 7"  (1.702 m), weight 243 lb (110.2 kg).

## 2017-03-26 ENCOUNTER — Telehealth: Payer: Self-pay

## 2017-03-26 DIAGNOSIS — M25569 Pain in unspecified knee: Secondary | ICD-10-CM | POA: Diagnosis not present

## 2017-03-26 LAB — CULTURE, URINE COMPREHENSIVE

## 2017-03-26 MED ORDER — NITROFURANTOIN MONOHYD MACRO 100 MG PO CAPS: 100.0000 mg | ORAL_CAPSULE | Freq: Two times a day (BID) | ORAL | 0 refills | Status: DC

## 2017-03-26 NOTE — Telephone Encounter (Signed)
LMOM-medication sent to pharmacy 

## 2017-03-26 NOTE — Telephone Encounter (Signed)
-----   Message from Harle BattiestShannon A McGowan, PA-C sent at 03/26/2017 12:08 PM EDT ----- Please let Anne Ryan know that her urine culture is positive.  She needs to start Macrobid 100 mg, bid for seven days.

## 2017-04-02 NOTE — Progress Notes (Signed)
04/03/2017 9:17 AM   Anne Ryan 17-May-1960 [id number]  Referring provider: Leone Haven, MD [address] [place], [place] [address]  Chief Complaint  Patient presents with  . Recurrent UTI    follow-up    HPI: Patient is a 57 year old Caucasian female with vaginal atrophy and recurrent UTIs who presents today for a one-month follow-up.    Background history Patient is a 78 -year-old Caucasian female who is referred to [place] by, Dr. Caryl Bis, for recurrent urinary tract infections.  Patient states that she has been dealing with recurrent UTI's since she moved her from [place] in 2015.  She was being followed at [facility] at that time.  She had three documented positive urine cultures with them.  One with Kleb and two with E. Coli.  Both organisms with a variable resistance pattern.  She states they ran all the tests and did not find any problems.  Reviewing her records she had a RUS which was normal and a bladder scan which noted a PVR of 46 mL.  She was started on Premarin cream with them.  She has since stopped using the cream.  More recently, she has had three positive urine cultures for E. Coli with a variable resistance pattern and two with Group B Strep.  Her symptoms with a urinary tract infection consist of frequency, dysuria and suprapubic pain and cramping.   She denies gross hematuria, back pain, abdominal pain or flank pain with UTI's.  She has not had any recent fevers, chills, nausea or vomiting.   She states that she had several bladder surgeries as a child for reflux.  She also had the placement of a TVT in 1990.  She is sexually active.  She has not noted a correlation with her urinary tract infections and sexual intercourse.    She does not engage in anal sex.    She is voiding before and after sex.  She is postmenopausal.   She denies constipation and/or diarrhea.  She does engage in good perineal hygiene. She does not take tub baths.   She does not have  incontinence.  She is using incontinence pads daily just in case.  She is not having pain with bladder filling.  She has not had any recent imaging studies.  She is drinking 40 ounces of water daily.   One diet Pepsi daily.  No coffee.  No juices.  Social alcohol.    At her visit on 03/08/2017,  she was baseline nocturia and leakage.  Her PVR today was 238 mL.  Her UA was nitrite positive, with many bacteria and > 30 WBC's.  She felt she was getting an UTI.  Her urine culture was positive for E.Coli with an increasing resistance pattern.  She was given a seven day course of Macrobid.  She was restarted on vaginal estrogen cream.   We discussed urinary tract infection prevention strategies.  Today, she has been experiencing urgency x 0-3, frequency x 8 or more, is restricting fluids to avoid visits to the restroom,  is engaging in toilet mapping, incontinence x 0-3 and nocturia x 0-3.  Her PVR is 203 mL.   Patient denies any gross hematuria, dysuria or suprapubic/flank pain.  Patient denies any fevers, chills, nausea or vomiting.  Her biggest complaint today is nocturia.   She has no symptoms of an UTI at this time, but she is fearful that it will return as it has done in the past.  Her clean  catch UA was negative.        PMH: Past Medical History:  Diagnosis Date  . Chickenpox   . History of recurrent UTIs     Surgical History: Past Surgical History:  Procedure Laterality Date  . BLADDER SURGERY  1968  . CARPAL TUNNEL RELEASE    . TONSILLECTOMY  1986  . TRANSVAGINAL TAPE (TVT) REMOVAL      Home Medications:  Allergies as of 04/03/2017      Reactions   Methenamine Rash   Sulfa Antibiotics Rash      Medication List        Accurate as of 04/03/17  9:17 AM. Always use your most recent med list.          amLODipine 5 MG tablet Commonly known as:  NORVASC Take 1 tablet (5 mg total) by mouth daily.   estradiol 0.1 MG/GM vaginal cream Commonly known as:  ESTRACE Apply a pea-sized  amount to the external vagina on Monday, Wednesday and Friday nights   Fish Oil 1000 MG Caps Take by mouth.   nitrofurantoin (macrocrystal-monohydrate) 100 MG capsule Commonly known as:  MACROBID Take 1 capsule (100 mg total) by mouth every 12 (twelve) hours.   nitrofurantoin (macrocrystal-monohydrate) 100 MG capsule Commonly known as:  MACROBID Take 1 capsule (100 mg total) by mouth every 12 (twelve) hours.   tamsulosin 0.4 MG Caps capsule Commonly known as:  FLOMAX Take 1 capsule (0.4 mg total) by mouth daily.   THERA Tabs Take by mouth.   VITAMIN B 12 PO Take by mouth.       Allergies:  Allergies  Allergen Reactions  . Methenamine Rash  . Sulfa Antibiotics Rash    Family History: Family History  Problem Relation Age of Onset  . Brain cancer Mother   . Heart disease Mother   . Hyperlipidemia Mother   . Hypertension Mother   . Diabetes Mother   . Cirrhosis Father   . Alcoholism Father   . Diabetes Sister   . Hypertension Sister   . Sleep apnea Sister     Social History:  reports that she has quit smoking. She has never used smokeless tobacco. She reports that she does not drink alcohol or use drugs.  ROS: UROLOGY Frequent Urination?: No Hard to postpone urination?: No Burning/pain with urination?: No Get up at night to urinate?: Yes Leakage of urine?: No Urine stream starts and stops?: No Trouble starting stream?: No Do you have to strain to urinate?: No Blood in urine?: No Urinary tract infection?: No Sexually transmitted disease?: No Injury to kidneys or bladder?: No Painful intercourse?: No Weak stream?: No Currently pregnant?: No Vaginal bleeding?: No Last menstrual period?: n  Gastrointestinal Nausea?: No Vomiting?: No Indigestion/heartburn?: No Diarrhea?: No Constipation?: No  Constitutional Fever: No Night sweats?: No Weight loss?: No Fatigue?: No  Skin Skin rash/lesions?: No Itching?: No  Eyes Blurred vision?: No Double  vision?: No  Ears/Nose/Throat Sore throat?: No Sinus problems?: No  Hematologic/Lymphatic Swollen glands?: No Easy bruising?: No  Cardiovascular Leg swelling?: No Chest pain?: No  Respiratory Cough?: No Shortness of breath?: No  Endocrine Excessive thirst?: No  Musculoskeletal Back pain?: No Joint pain?: No  Neurological Headaches?: No Dizziness?: No  Psychologic Depression?: No Anxiety?: No  Physical Exam: BP 136/86   Pulse 67   Ht 5' 7" (1.702 m)   Wt 246 lb 1.6 oz (111.6 kg)   BMI 38.54 kg/m   Constitutional: Well nourished. Alert and oriented, No acute   distress. HEENT: [place] AT, moist mucus membranes. Trachea midline, no masses. Cardiovascular: No clubbing, cyanosis, or edema. Respiratory: Normal respiratory effort, no increased work of breathing. GI: Abdomen is soft, non tender, non distended, no abdominal masses. Liver and spleen not palpable.  No hernias appreciated.  Stool sample for occult testing is not indicated.   Skin: No rashes, bruises or suspicious lesions. Lymph: No cervical or inguinal adenopathy. Neurologic: Grossly intact, no focal deficits, moving all 4 extremities. Psychiatric: Normal mood and affect.   Laboratory Data: Lab Results  Component Value Date   WBC 5.2 03/28/2016   HGB 13.8 03/28/2016   HCT 40.3 03/28/2016   MCV 85.9 03/28/2016   PLT 280.0 03/28/2016    Lab Results  Component Value Date   CREATININE 0.83 03/28/2016    No results found for: PSA  No results found for: TESTOSTERONE  Lab Results  Component Value Date   HGBA1C 5.5 03/28/2016    Lab Results  Component Value Date   TSH 4.31 05/12/2016       Component Value Date/Time   CHOL 182 03/28/2016 1551   HDL 40.50 03/28/2016 1551   CHOLHDL 4 03/28/2016 1551   VLDL 21.8 03/28/2016 1551   LDLCALC 119 (H) 03/28/2016 1551    Lab Results  Component Value Date   AST 21 03/28/2016   Lab Results  Component Value Date   ALT 22 03/28/2016   No  components found for: ALKALINEPHOPHATASE No components found for: BILIRUBINTOTAL  No results found for: ESTRADIOL  Urinalysis Negative.  See Epic.     I have reviewed the labs.   Pertinent Imaging: Results for Ryan, Anne (MRN [id number]) as of 04/03/2017 09:08  Ref. Range 04/03/2017 08:44  Scan Result Unknown [id number]    Assessment & Plan:    1. Recurrent UTI's  - criteria for recurrent UTI has been met with 2 or more infections in 6 months or 3 or greater infections in one year   -Reviewed UTI prevention strategies                                - Urinalysis, Complete - negative   - Bladder Scan (Post Void Residual) in office  2. Vaginal atrophy She found the vaginal estrogen cream cost prohibitive - she could not fill the prescription  3. Incomplete bladder emptying Discussed how this may or may not be contributing to her recurrent urinary tract infections Discussed having a trial of an alpha blocker or undergoing cystoscopically/UDS at this time for further evaluation Patient would like to have a trial of an alpha blocker a prescription for tamsulosin 0.4 mg daily is sent to her pharmacy -side effects were discussed with the patient  Return in about 1 month (around 05/04/2017) for PVR and OAB questionnaire.  These notes generated with voice recognition software. I apologize for typographical errors.  SHANNON MCGOWAN, PA-C  [facility] [address], [address] [place], [place] [address] [phone]  

## 2017-04-03 ENCOUNTER — Encounter: Payer: Self-pay | Admitting: Urology

## 2017-04-03 ENCOUNTER — Ambulatory Visit (INDEPENDENT_AMBULATORY_CARE_PROVIDER_SITE_OTHER): Payer: BLUE CROSS/BLUE SHIELD | Admitting: Urology

## 2017-04-03 ENCOUNTER — Other Ambulatory Visit: Payer: Self-pay

## 2017-04-03 VITALS — BP 136/86 | HR 67 | Ht 67.0 in | Wt 246.1 lb

## 2017-04-03 DIAGNOSIS — N952 Postmenopausal atrophic vaginitis: Secondary | ICD-10-CM | POA: Diagnosis not present

## 2017-04-03 DIAGNOSIS — N390 Urinary tract infection, site not specified: Secondary | ICD-10-CM

## 2017-04-03 DIAGNOSIS — R339 Retention of urine, unspecified: Secondary | ICD-10-CM

## 2017-04-03 LAB — URINALYSIS, COMPLETE
Bilirubin, UA: NEGATIVE
Glucose, UA: NEGATIVE
Ketones, UA: NEGATIVE
Leukocytes, UA: NEGATIVE
Nitrite, UA: NEGATIVE
Protein, UA: NEGATIVE
RBC, UA: NEGATIVE
Specific Gravity, UA: 1.015 (ref 1.005–1.030)
Urobilinogen, Ur: 0.2 mg/dL (ref 0.2–1.0)
pH, UA: 7 (ref 5.0–7.5)

## 2017-04-03 LAB — MICROSCOPIC EXAMINATION
Bacteria, UA: NONE SEEN
RBC, UA: NONE SEEN /hpf (ref 0–2)

## 2017-04-03 LAB — BLADDER SCAN AMB NON-IMAGING: Scan Result: 203

## 2017-04-03 MED ORDER — TAMSULOSIN HCL 0.4 MG PO CAPS: 0.4000 mg | ORAL_CAPSULE | Freq: Every day | ORAL | 3 refills | Status: DC

## 2017-04-03 NOTE — Patient Instructions (Addendum)
Tamsulosin capsules What is this medicine? TAMSULOSIN (tam SOO loe sin) is used to treat enlargement of the prostate gland in men, a condition called benign prostatic hyperplasia or BPH. It is not for use in women. It works by relaxing muscles in the prostate and bladder neck. This improves urine flow and reduces BPH symptoms. This medicine may be used for other purposes; ask your health care provider or pharmacist if you have questions. COMMON BRAND NAME(S): Flomax What should I tell my health care provider before I take this medicine? They need to know if you have any of the following conditions: -advanced kidney disease -advanced liver disease -low blood pressure -prostate cancer -an unusual or allergic reaction to tamsulosin, sulfa drugs, other medicines, foods, dyes, or preservatives -pregnant or trying to get pregnant -breast-feeding How should I use this medicine? Take this medicine by mouth about 30 minutes after the same meal every day. Follow the directions on the prescription label. Swallow the capsules whole with a glass of water. Do not crush, chew, or open capsules. Do not take your medicine more often than directed. Do not stop taking your medicine unless your doctor tells you to. Talk to your pediatrician regarding the use of this medicine in children. Special care may be needed. Overdosage: If you think you have taken too much of this medicine contact a poison control center or emergency room at once. NOTE: This medicine is only for you. Do not share this medicine with others. What if I miss a dose? If you miss a dose, take it as soon as you can. If it is almost time for your next dose, take only that dose. Do not take double or extra doses. If you stop taking your medicine for several days or more, ask your doctor or health care professional what dose you should start back on. What may interact with this medicine? -cimetidine -fluoxetine -ketoconazole -medicines for  erectile disfunction like sildenafil, tadalafil, vardenafil -medicines for high blood pressure -other alpha-blockers like alfuzosin, doxazosin, phentolamine, phenoxybenzamine, prazosin, terazosin -warfarin This list may not describe all possible interactions. Give your health care provider a list of all the medicines, herbs, non-prescription drugs, or dietary supplements you use. Also tell them if you smoke, drink alcohol, or use illegal drugs. Some items may interact with your medicine. What should I watch for while using this medicine? Visit your doctor or health care professional for regular check ups. You will need lab work done before you start this medicine and regularly while you are taking it. Check your blood pressure as directed. Ask your health care professional what your blood pressure should be, and when you should contact him or her. This medicine may make you feel dizzy or lightheaded. This is more likely to happen after the first dose, after an increase in dose, or during hot weather or exercise. Drinking alcohol and taking some medicines can make this worse. Do not drive, use machinery, or do anything that needs mental alertness until you know how this medicine affects you. Do not sit or stand up quickly. If you begin to feel dizzy, sit down until you feel better. These effects can decrease once your body adjusts to the medicine. Contact your doctor or health care professional right away if you have an erection that lasts longer than 4 hours or if it becomes painful. This may be a sign of a serious problem and must be treated right away to prevent permanent damage. If you are thinking of having cataract   surgery, tell your eye surgeon that you have taken this medicine. What side effects may I notice from receiving this medicine? Side effects that you should report to your doctor or health care professional as soon as possible: -allergic reactions like skin rash or itching, hives, swelling  of the lips, mouth, tongue, or throat -breathing problems -change in vision -feeling faint or lightheaded -irregular heartbeat -prolonged or painful erection -weakness Side effects that usually do not require medical attention (report to your doctor or health care professional if they continue or are bothersome): -back pain -change in sex drive or performance -constipation, nausea or vomiting -cough -drowsy -runny or stuffy nose -trouble sleeping This list may not describe all possible side effects. Call your doctor for medical advice about side effects. You may report side effects to FDA at 1-800-FDA-1088. Where should I keep my medicine? Keep out of the reach of children. Store at room temperature between 15 and 30 degrees C (59 and 86 degrees F). Throw away any unused medicine after the expiration date. NOTE: This sheet is a summary. It may not cover all possible information. If you have questions about this medicine, talk to your doctor, pharmacist, or health care provider.  2018 Elsevier/Gold Standard (2011-12-27 14:11:34)                                              Urinary Tract Infection Prevention Patient Education Stay Hydrated: Urinary tract infections (UTIs) are less likely to occur in someone who is drinking enough water to promote regular urination, so it is very important to stay hydrated in order to help flush out bacteria from the urinary tract. Respond to "Nature's Call": It is always a good idea to urinate as soon as you feel the need. While "holding it in" does not directly cause an infection, it can cause overdistension that can damage the lining of the bladder, making it more vulnerable to bacteria. Remove Tampons Before Going: Remember to always take out tampons before urinating, and change tampons often.  Practice Proper Bathroom Hygiene: To keep bacteria near the urethral opening to a minimum, it is important to practice proper wiping techniques (i.e. front to  back wiping) to help prevent rectal bacteria from entering the uretro-genital area. It can also be helpful to take showers and avoid soaking in the bathtub.  Take a Vitamin C Supplement: About 1,000 milligrams of vitamin C taken daily can help inhibit the growth of some bacteria by acidifying the urine. Maintain Control with Cranberries: Cranberries contain hippuronic acid, which is a natural antiseptic that may help prevent the adherence of bacteria to the bladder lining. Drinking 100% pure cranberry juice or taking over the counter cranberry supplements twice daily may help to prevent an infection. However, it is important to note that cranberry juices/supplements are not helpful once a urinary tract infection (UTI) is present. Strengthen Your Core: Often, a lazy bladder (unable to empty urine properly) occurs due to lower back problem, so consider doing exercises to help strengthen your back, pelvic floor, and stomach muscles.  Pay Attention to Your Urine: Your urine can change color for a variety of reasons, including from the medications you take, so pay close attention to it to monitor your overall health. One key thing to note is that if your urine is typically a darker yellow, your body is dehydrated, so you need to  step up your water intake.

## 2017-04-06 ENCOUNTER — Encounter: Payer: Self-pay | Admitting: Family Medicine

## 2017-04-06 ENCOUNTER — Other Ambulatory Visit: Payer: Self-pay

## 2017-04-06 ENCOUNTER — Ambulatory Visit (INDEPENDENT_AMBULATORY_CARE_PROVIDER_SITE_OTHER): Payer: BLUE CROSS/BLUE SHIELD | Admitting: Family Medicine

## 2017-04-06 VITALS — BP 104/72 | HR 80 | Temp 98.3°F | Ht 66.5 in | Wt 245.6 lb

## 2017-04-06 DIAGNOSIS — Z13220 Encounter for screening for lipoid disorders: Secondary | ICD-10-CM

## 2017-04-06 DIAGNOSIS — Z0001 Encounter for general adult medical examination with abnormal findings: Secondary | ICD-10-CM | POA: Diagnosis not present

## 2017-04-06 DIAGNOSIS — R7989 Other specified abnormal findings of blood chemistry: Secondary | ICD-10-CM | POA: Diagnosis not present

## 2017-04-06 DIAGNOSIS — Z23 Encounter for immunization: Secondary | ICD-10-CM

## 2017-04-06 DIAGNOSIS — J309 Allergic rhinitis, unspecified: Secondary | ICD-10-CM | POA: Diagnosis not present

## 2017-04-06 DIAGNOSIS — E669 Obesity, unspecified: Secondary | ICD-10-CM

## 2017-04-06 LAB — COMPREHENSIVE METABOLIC PANEL
ALT: 28 U/L (ref 0–35)
AST: 23 U/L (ref 0–37)
Albumin: 4.3 g/dL (ref 3.5–5.2)
Alkaline Phosphatase: 78 U/L (ref 39–117)
BUN: 22 mg/dL (ref 6–23)
CO2: 28 mEq/L (ref 19–32)
Calcium: 9.3 mg/dL (ref 8.4–10.5)
Chloride: 107 mEq/L (ref 96–112)
Creatinine, Ser: 0.79 mg/dL (ref 0.40–1.20)
GFR: 80.23 mL/min (ref >60.00)
Glucose, Bld: 110 mg/dL — ABNORMAL HIGH (ref 70–99)
Potassium: 4 mEq/L (ref 3.5–5.1)
Sodium: 137 mEq/L (ref 135–145)
Total Bilirubin: 0.9 mg/dL (ref 0.2–1.2)
Total Protein: 7.5 g/dL (ref 6.0–8.3)

## 2017-04-06 LAB — LIPID PANEL
Cholesterol: 178 mg/dL (ref 0–200)
HDL: 39.7 mg/dL (ref >39.00)
LDL Cholesterol: 121 mg/dL — ABNORMAL HIGH (ref 0–99)
NonHDL: 138.61
Total CHOL/HDL Ratio: 4
Triglycerides: 90 mg/dL (ref 0.0–149.0)
VLDL: 18 mg/dL (ref 0.0–40.0)

## 2017-04-06 LAB — HEMOGLOBIN A1C: Hgb A1c MFr Bld: 5.5 % (ref 4.6–6.5)

## 2017-04-06 LAB — TSH: TSH: 3.21 u[IU]/mL (ref 0.35–4.50)

## 2017-04-06 MED ORDER — AMLODIPINE BESYLATE 5 MG PO TABS: 5.0000 mg | ORAL_TABLET | Freq: Every day | ORAL | 3 refills | Status: DC

## 2017-04-06 MED ORDER — LORATADINE 10 MG PO TABS: 10.0000 mg | ORAL_TABLET | Freq: Every day | ORAL | 11 refills | Status: DC

## 2017-04-06 MED ORDER — FLUTICASONE PROPIONATE 50 MCG/ACT NA SUSP: 2.0000 | Freq: Every day | NASAL | 6 refills | Status: AC

## 2017-04-06 NOTE — Patient Instructions (Signed)
Nice to see you. Please try to work on diet and exercise. We will contact you with your lab results. Please try the Claritin and Flonase for your allergy symptoms.  I would like to see you back in 1-2 months to recheck to ensure that your symptoms have gotten better.  If you continue to have the swallowing trouble despite this treatment please let us know.

## 2017-04-06 NOTE — Assessment & Plan Note (Addendum)
Physical exam completed.  Pelvic exam deferred given Pap smear up-to-date.  Discussed diet and exercise.  Shingrix given.  Discussed this vaccine and potential do feel a little ill over the next 1-2 days.  Health maintenance up-to-date.  She opts out of HIV testing.  Lab work as outlined below.

## 2017-04-06 NOTE — Assessment & Plan Note (Addendum)
Patient with allergic rhinitis symptoms.  I suspect postnasal drip and throat irritation is causing some of the swallowing difficulty.  We will treat her allergic rhinitis and follow-up with her in 1-2 months.  If she does not start to improve with the treatment she will contact us prior to follow-up.  If the swallowing is not improving with this treatment would refer to ENT or GI.

## 2017-04-06 NOTE — Progress Notes (Signed)
Tommi Rumps, MD Phone: [phone]  Anne Ryan is a 57 y.o. female who presents today for physical exam.  Exercises by walking at work and working on her [place] acres. She does overeat.  She is trying to cut back on portion sizes. Mammogram is up-to-date. Pap smear negative cells and HPV 03/28/16. Tetanus vaccination up-to-date. Flu shot up-to-date. Needs a shingles vaccine. Colonoscopy is up-to-date. Hepatitis C testing up-to-date. Declines HIV testing. No tobacco use or illicit drug use.  Rare alcohol use.  She reports she has had some allergy symptoms for a couple of months now.  Notes fairly significant postnasal drainage as well as rhinorrhea and blowing her nose.  With this she feels like it is a little more difficult to get food to go down when she swallows.  She notes no food sticking.  No allergy medications.  Does note a little bit of reflux rarely for which she takes Tums.  Occurs at most once a month.  Active Ambulatory Problems    Diagnosis Date Noted  . UTI (urinary tract infection) 06/28/2015  . Rash and nonspecific skin eruption 06/28/2015  . Encounter for general adult medical examination with abnormal findings 03/28/2016  . Skin lesion on examination 03/28/2016  . Obesity 04/03/2013  . Atrophic vaginitis 04/03/2013  . Allergic rhinitis 04/06/2017   Resolved Ambulatory Problems    Diagnosis Date Noted  . No Resolved Ambulatory Problems   Past Medical History:  Diagnosis Date  . Chickenpox   . History of recurrent UTIs     Family History  Problem Relation Age of Onset  . Brain cancer Mother   . Heart disease Mother   . Hyperlipidemia Mother   . Hypertension Mother   . Diabetes Mother   . Cirrhosis Father   . Alcoholism Father   . Diabetes Sister   . Hypertension Sister   . Sleep apnea Sister     Social History   Socioeconomic History  . Marital status: Married    Spouse name: Not on file  . Number of children: Not on file  . Years of  education: Not on file  . Highest education level: Not on file  Occupational History  . Occupation: Engineering geologist   Social Needs  . Financial resource strain: Not on file  . Food insecurity:    Worry: Not on file    Inability: Not on file  . Transportation needs:    Medical: Not on file    Non-medical: Not on file  Tobacco Use  . Smoking status: Former Research scientist (life sciences)  . Smokeless tobacco: Never Used  Substance and Sexual Activity  . Alcohol use: No    Alcohol/week: 0.0 oz  . Drug use: No  . Sexual activity: Yes  Lifestyle  . Physical activity:    Days per week: Not on file    Minutes per session: Not on file  . Stress: Not on file  Relationships  . Social connections:    Talks on phone: Not on file    Gets together: Not on file    Attends religious service: Not on file    Active member of club or organization: Not on file    Attends meetings of clubs or organizations: Not on file    Relationship status: Not on file  . Intimate partner violence:    Fear of current or ex partner: Not on file    Emotionally abused: Not on file    Physically abused: Not on file    Forced sexual  activity: Not on file  Other Topics Concern  . Not on file  Social History Narrative  . Not on file    ROS  General:  Negative for nexplained weight loss, fever Skin: Negative for new or changing mole, sore that won't heal HEENT: Positive for trouble swallowing, negative for trouble hearing, trouble seeing, ringing in ears, mouth sores, hoarseness, change in voice. CV:  Negative for chest pain, dyspnea, edema, palpitations Resp: Negative for cough, dyspnea, hemoptysis GI: Negative for nausea, vomiting, diarrhea, constipation, abdominal pain, melena, hematochezia. GU: Negative for dysuria, incontinence, urinary hesitance, hematuria, vaginal or penile discharge, polyuria, sexual difficulty, lumps in testicle or breasts MSK: Negative for muscle cramps or aches, joint pain or swelling Neuro: Negative  for headaches, weakness, numbness, dizziness, passing out/fainting Psych: Negative for depression, anxiety, memory problems  Objective  Physical Exam Vitals:   04/06/17 0933  BP: 104/72  Pulse: 80  Temp: 98.3 F (36.8 C)  SpO2: 98%    BP Readings from Last 3 Encounters:  04/06/17 104/72  04/03/17 136/86  03/23/17 132/82   Wt Readings from Last 3 Encounters:  04/06/17 245 lb 9.6 oz (111.4 kg)  04/03/17 246 lb 1.6 oz (111.6 kg)  03/23/17 243 lb (110.2 kg)    Physical Exam  Constitutional: No distress.  HENT:  Head: Normocephalic and atraumatic.  Mouth/Throat: Oropharynx is clear and moist. No oropharyngeal exudate.  Eyes: Pupils are equal, round, and reactive to light. Conjunctivae are normal.  Neck: Neck supple.  Cardiovascular: Normal rate, regular rhythm and normal heart sounds.  Pulmonary/Chest: Effort normal and breath sounds normal.  Abdominal: Soft. Bowel sounds are normal. She exhibits no distension. There is no tenderness. There is no rebound and no guarding.  Genitourinary:  Genitourinary Comments: Bilateral breasts no skin changes, tenderness, masses, or nipple inversion, no axillary masses bilaterally  Musculoskeletal: She exhibits no edema.  Lymphadenopathy:    She has no cervical adenopathy.  Neurological: She is alert. Gait normal.  Skin: Skin is warm and dry. She is not diaphoretic.  Psychiatric: Mood and affect normal.     Assessment/Plan:   Encounter for general adult medical examination with abnormal findings Physical exam completed.  Pelvic exam deferred given Pap smear up-to-date.  Discussed diet and exercise.  Shingrix given.  Discussed this vaccine and potential do feel a little ill over the next 1-2 days.  Health maintenance up-to-date.  She opts out of HIV testing.  Lab work as outlined below.  Allergic rhinitis Patient with allergic rhinitis symptoms.  I suspect postnasal drip and throat irritation is causing some of the swallowing  difficulty.  We will treat her allergic rhinitis and follow-up with her in 1-2 months.  If she does not start to improve with the treatment she will contact us prior to follow-up.  If the swallowing is not improving with this treatment would refer to ENT or GI.   Orders Placed This Encounter  Procedures  . Varicella-zoster vaccine IM (Shingrix)  . Lipid panel  . Comp Met (CMET)  . HgB A1c  . TSH    Meds ordered this encounter  Medications  . fluticasone (FLONASE) 50 MCG/ACT nasal spray    Sig: Place 2 sprays into both nostrils daily.    Dispense:  16 g    Refill:  6  . loratadine (CLARITIN) 10 MG tablet    Sig: Take 1 tablet (10 mg total) by mouth daily.    Dispense:  30 tablet    Refill:  11  .  amLODipine (NORVASC) 5 MG tablet    Sig: Take 1 tablet (5 mg total) by mouth daily.    Dispense:  90 tablet    Refill:  [place], MD [place]

## 2017-05-03 ENCOUNTER — Ambulatory Visit: Payer: BLUE CROSS/BLUE SHIELD | Admitting: Urology

## 2017-06-08 ENCOUNTER — Encounter: Payer: Self-pay | Admitting: Family Medicine

## 2017-06-08 ENCOUNTER — Other Ambulatory Visit (HOSPITAL_COMMUNITY)
Admission: RE | Admit: 2017-06-08 | Discharge: 2017-06-08 | Disposition: A | Discharge status: Home / Self Care | Payer: BLUE CROSS/BLUE SHIELD | Source: Ambulatory Visit | Attending: Family Medicine | Admitting: Family Medicine

## 2017-06-08 ENCOUNTER — Ambulatory Visit (INDEPENDENT_AMBULATORY_CARE_PROVIDER_SITE_OTHER): Payer: BLUE CROSS/BLUE SHIELD | Admitting: Family Medicine

## 2017-06-08 VITALS — BP 134/92 | HR 74 | Temp 97.8°F | Resp 16 | Ht 66.5 in | Wt 249.4 lb

## 2017-06-08 DIAGNOSIS — N950 Postmenopausal bleeding: Secondary | ICD-10-CM | POA: Insufficient documentation

## 2017-06-08 DIAGNOSIS — R300 Dysuria: Secondary | ICD-10-CM

## 2017-06-08 DIAGNOSIS — N3000 Acute cystitis without hematuria: Secondary | ICD-10-CM

## 2017-06-08 DIAGNOSIS — N898 Other specified noninflammatory disorders of vagina: Secondary | ICD-10-CM | POA: Insufficient documentation

## 2017-06-08 DIAGNOSIS — I10 Essential (primary) hypertension: Secondary | ICD-10-CM | POA: Insufficient documentation

## 2017-06-08 DIAGNOSIS — J309 Allergic rhinitis, unspecified: Secondary | ICD-10-CM | POA: Diagnosis not present

## 2017-06-08 DIAGNOSIS — Z23 Encounter for immunization: Secondary | ICD-10-CM | POA: Diagnosis not present

## 2017-06-08 LAB — URINALYSIS, MICROSCOPIC ONLY: RBC / HPF: NONE SEEN (ref 0–?)

## 2017-06-08 LAB — POCT URINALYSIS DIPSTICK
Bilirubin, UA: NEGATIVE
Blood, UA: NEGATIVE
Glucose, UA: NEGATIVE
Ketones, UA: NEGATIVE
Nitrite, UA: POSITIVE
Protein, UA: NEGATIVE
Spec Grav, UA: 1.02 (ref 1.010–1.025)
Urobilinogen, UA: 0.2 E.U./dL
pH, UA: 7 (ref 5.0–8.0)

## 2017-06-08 MED ORDER — CEPHALEXIN 500 MG PO CAPS: 500.0000 mg | ORAL_CAPSULE | Freq: Two times a day (BID) | ORAL | 0 refills | Status: DC

## 2017-06-08 NOTE — Progress Notes (Signed)
  Marikay Alar, MD Phone: [phone]  Anne Ryan is a 57 y.o. female who presents today for f/u.  CC: allergies, UTI symptoms  UTI: Dysuria- yes Frequency- increased  Urgency- yes  Hematuria- no  Fever- no Abd pain- no  Vaginal d/c- yes  She notes recently having had some vaginal discharge noted on her underwear.  She does note some vaginal itching.  She did have some spots of blood in her underwear weeks ago from her vagina.  She is not sexually active.  HYPERTENSION  Disease Monitoring  Home BP Monitoring not checking Chest pain- no    Dyspnea- no Medications  Compliance-  Taking amlodipine.  Edema- very rare LE, no orthopnea or PND  Allergic rhinitis: This is improved.  She has been on Flonase consistently.  Not consistently taking the Claritin.  She is swallowing okay now that her postnasal drip is improved.     Social History   Tobacco Use  Smoking Status Former Smoker  Smokeless Tobacco Never Used     ROS see history of present illness  Objective  Physical Exam Vitals:   06/08/17 0918  BP: (!) 134/92  Pulse: 74  Resp: 16  Temp: 97.8 F (36.6 C)  SpO2: 97%    BP Readings from Last 3 Encounters:  06/08/17 (!) 134/92  04/06/17 104/72  04/03/17 136/86   Wt Readings from Last 3 Encounters:  06/08/17 249 lb 6.4 oz (113.1 kg)  04/06/17 245 lb 9.6 oz (111.4 kg)  04/03/17 246 lb 1.6 oz (111.6 kg)    Physical Exam  Constitutional: No distress.  Cardiovascular: Normal rate, regular rhythm and normal heart sounds.  Pulmonary/Chest: Effort normal and breath sounds normal.  Abdominal: Soft. Bowel sounds are normal. She exhibits no distension. There is no tenderness.  Genitourinary:  Genitourinary Comments: Chaperone used, normal labia, atrophic vaginal mucosa with no obvious lesions, scant clearish vaginal discharge, normal-appearing cervix, no cervical motion tenderness, no adnexal tenderness or masses  Musculoskeletal: She exhibits no edema.    Neurological: She is alert.  Skin: Skin is warm and dry. She is not diaphoretic.     Assessment/Plan: Please see individual problem list.  Allergic rhinitis Symptoms have improved significantly.  She will continue Flonase.  UTI (urinary tract infection) Symptoms and UA consistent with UTI.  We will treat with Keflex.  Send urine for culture and microscopy.  Vaginal discharge Pelvic exam completed.  BD affirm collected.  Await results to consider treatment.  Postmenopausal bleeding Minimal bleeding though we will refer to GYN.  HTN (hypertension) Blood pressure has been well controlled.  We will have her return in 1 week for recheck.  If still elevated consider increasing amlodipine.   Orders Placed This Encounter  Procedures  . Urine Culture  . Varicella-zoster vaccine IM (Shingrix)  . Urine Microscopic Only  . Ambulatory referral to Gynecology    Referral Priority:   Routine    Referral Type:   Consultation    Referral Reason:   Specialty Services Required    Requested Specialty:   Gynecology    Number of Visits Requested:   1  . POCT Urinalysis Dipstick    Meds ordered this encounter  Medications  . cephALEXin (KEFLEX) 500 MG capsule    Sig: Take 1 capsule (500 mg total) by mouth 2 (two) times daily.    Dispense:  14 capsule    Refill:  0     Marikay Alar, MD [facility]

## 2017-06-08 NOTE — Assessment & Plan Note (Signed)
Blood pressure has been well controlled.  We will have her return in 1 week for recheck.  If still elevated consider increasing amlodipine.

## 2017-06-08 NOTE — Assessment & Plan Note (Signed)
Minimal bleeding though we will refer to GYN.

## 2017-06-08 NOTE — Assessment & Plan Note (Addendum)
Symptoms have improved significantly.  She will continue Flonase.

## 2017-06-08 NOTE — Patient Instructions (Addendum)
Nice to see you. We will treat your UTI with Keflex. Please continue your Flonase. We will get to see gynecology for the vaginal bleeding. We will call you with the results of your vaginal swab. We will have you return in one week for BP check.

## 2017-06-08 NOTE — Assessment & Plan Note (Signed)
Symptoms and UA consistent with UTI.  We will treat with Keflex.  Send urine for culture and microscopy.

## 2017-06-08 NOTE — Assessment & Plan Note (Addendum)
Pelvic exam completed.  BD affirm collected.  Await results to consider treatment.

## 2017-06-10 ENCOUNTER — Telehealth: Payer: Self-pay | Admitting: Family Medicine

## 2017-06-10 LAB — URINE CULTURE
MICRO NUMBER:: 90657713
SPECIMEN QUALITY:: ADEQUATE

## 2017-06-10 MED ORDER — NITROFURANTOIN MONOHYD MACRO 100 MG PO CAPS: 100.0000 mg | ORAL_CAPSULE | Freq: Two times a day (BID) | ORAL | 0 refills | Status: DC

## 2017-06-10 NOTE — Telephone Encounter (Signed)
Spoke with patient regarding culture results. The bacteria is not sensitive to keflex. We will change her to macrobid. This was sent to her pharmacy.

## 2017-06-11 ENCOUNTER — Other Ambulatory Visit: Payer: Self-pay | Admitting: Family Medicine

## 2017-06-11 LAB — CERVICOVAGINAL ANCILLARY ONLY: Wet Prep (BD Affirm): POSITIVE — AB

## 2017-06-11 MED ORDER — METRONIDAZOLE 500 MG PO TABS: 500.0000 mg | ORAL_TABLET | Freq: Two times a day (BID) | ORAL | 0 refills | Status: DC

## 2017-06-12 ENCOUNTER — Ambulatory Visit: Payer: BLUE CROSS/BLUE SHIELD | Admitting: Urology

## 2017-06-18 DIAGNOSIS — M25569 Pain in unspecified knee: Secondary | ICD-10-CM | POA: Diagnosis not present

## 2017-06-19 ENCOUNTER — Encounter: Payer: Self-pay | Admitting: *Deleted

## 2017-06-19 ENCOUNTER — Ambulatory Visit (INDEPENDENT_AMBULATORY_CARE_PROVIDER_SITE_OTHER): Payer: BLUE CROSS/BLUE SHIELD | Admitting: *Deleted

## 2017-06-19 VITALS — BP 120/86 | HR 60 | Resp 18

## 2017-06-19 DIAGNOSIS — I10 Essential (primary) hypertension: Secondary | ICD-10-CM

## 2017-06-19 NOTE — Progress Notes (Signed)
Patient in for 1 week blood pressure check from last Office visit. Patient BP left arm 118/86 pulse 56 allowed patient to wait additional 10 - 15 minutes checked right arm 120/86 pulse 60, patient stated she  Is feeling better from  last visit.

## 2017-06-19 NOTE — Progress Notes (Signed)
Diastolic blood pressure is slightly above goal.  I would like to increase her amlodipine to 10 mg daily if she is willing.  If she is okay with this please refill her amlodipine at 10 mg by mouth daily.  She will need a BP check in 1 month.  Thanks.

## 2017-06-20 ENCOUNTER — Encounter: Payer: Self-pay | Admitting: Family Medicine

## 2017-06-20 ENCOUNTER — Telehealth: Payer: Self-pay | Admitting: Family Medicine

## 2017-06-20 DIAGNOSIS — N390 Urinary tract infection, site not specified: Secondary | ICD-10-CM

## 2017-06-20 NOTE — Telephone Encounter (Signed)
Copied from CRM [id number]. Topic: Inquiry >> Jun 20, 2017  4:21 PM [name], Anne B wrote: Reason for CRM: pt called to inform pcp that the abx for the UTI have been completed but the pt is still having the same symptoms; pt is wondering what needs to be done, either another round of abx or a visit, call pt to advise

## 2017-06-21 NOTE — Telephone Encounter (Signed)
Patient is communicating with provider through E- mail. They are presently working on getting her a urology referral.

## 2017-06-22 ENCOUNTER — Encounter: Payer: Self-pay | Admitting: Obstetrics and Gynecology

## 2017-06-22 ENCOUNTER — Other Ambulatory Visit: Payer: Self-pay | Admitting: *Deleted

## 2017-06-22 ENCOUNTER — Ambulatory Visit (INDEPENDENT_AMBULATORY_CARE_PROVIDER_SITE_OTHER): Payer: BLUE CROSS/BLUE SHIELD | Admitting: Obstetrics and Gynecology

## 2017-06-22 VITALS — BP 128/77 | HR 80 | Ht 66.5 in | Wt 243.3 lb

## 2017-06-22 DIAGNOSIS — N950 Postmenopausal bleeding: Secondary | ICD-10-CM | POA: Diagnosis not present

## 2017-06-22 MED ORDER — AMLODIPINE BESYLATE 10 MG PO TABS: 10.0000 mg | ORAL_TABLET | Freq: Every day | ORAL | 1 refills | Status: DC

## 2017-06-22 NOTE — Progress Notes (Signed)
HPI:      [name] is a 57 y.o. [id number] who LMP was No LMP recorded. Patient is postmenopausal.  Subjective:   She presents today with complaint of a single episode of postmenopausal bleeding.  She has had no bleeding since that time.  She described the bleeding as a small amount of spotting. She is not on HRT. Tubal ligation for birth control Took OCPs for approximately 10 years of her life Has had 2 successful pregnancies and breast-fed for short time after each. Her cycles have been irregular her whole life until menopause.    Hx: The following portions of the patient's history were reviewed and updated as appropriate:             She  has a past medical history of Chickenpox and History of recurrent UTIs. She does not have any pertinent problems on file. She  has a past surgical history that includes Tonsillectomy (1986); Carpal tunnel release; Transvaginal tape (tvt) removal; and Bladder surgery (1968). Her family history includes Alcoholism in her father; Brain cancer in her mother; Cirrhosis in her father; Diabetes in her mother and sister; Heart disease in her mother; Hyperlipidemia in her mother; Hypertension in her mother and sister; Sleep apnea in her sister. She  reports that she has quit smoking. She has never used smokeless tobacco. She reports that she does not drink alcohol or use drugs. She has a current medication list which includes the following prescription(s): amlodipine, ciprofloxacin, cranberry, cyanocobalamin, fluticasone, loratadine, thera, fish oil, ondansetron, and tamsulosin. She is allergic to methenamine and sulfa antibiotics.       Review of Systems:  Review of Systems  Constitutional: Denied constitutional symptoms, night sweats, recent illness, fatigue, fever, insomnia and weight loss.  Eyes: Denied eye symptoms, eye pain, photophobia, vision change and visual disturbance.  Ears/Nose/Throat/Neck: Denied ear, nose, throat or neck symptoms, hearing  loss, nasal discharge, sinus congestion and sore throat.  Cardiovascular: Denied cardiovascular symptoms, arrhythmia, chest pain/pressure, edema, exercise intolerance, orthopnea and palpitations.  Respiratory: Denied pulmonary symptoms, asthma, pleuritic pain, productive sputum, cough, dyspnea and wheezing.  Gastrointestinal: Denied, gastro-esophageal reflux, melena, nausea and vomiting.  Genitourinary: See HPI for additional information.  Musculoskeletal: Denied musculoskeletal symptoms, stiffness, swelling, muscle weakness and myalgia.  Dermatologic: Denied dermatology symptoms, rash and scar.  Neurologic: Denied neurology symptoms, dizziness, headache, neck pain and syncope.  Psychiatric: Denied psychiatric symptoms, anxiety and depression.  Endocrine: Denied endocrine symptoms including hot flashes and night sweats.   Meds:   Current Outpatient Medications on File Prior to Visit  Medication Sig Dispense Refill  . ciprofloxacin (CIPRO) 500 MG tablet Take 500 mg by mouth 2 (two) times daily.  0  . Cranberry 500 MG TABS Take by mouth.    . Cyanocobalamin (VITAMIN B 12 PO) Take by mouth.    . fluticasone (FLONASE) 50 MCG/ACT nasal spray Place 2 sprays into both nostrils daily. 16 g 6  . loratadine (CLARITIN) 10 MG tablet Take 1 tablet (10 mg total) by mouth daily. 30 tablet 11  . Multiple Vitamin (THERA) TABS Take by mouth.    . Omega-3 Fatty Acids (FISH OIL) 1000 MG CAPS Take by mouth.    . ondansetron (ZOFRAN-ODT) 4 MG disintegrating tablet DISSOLVE 1 TABLET IN MOUTH THREE TIMES DAILY AS NEEDED  0  . tamsulosin (FLOMAX) 0.4 MG CAPS capsule Take 1 capsule (0.4 mg total) by mouth daily. 30 capsule 3   No current facility-administered medications on file prior to visit.  Objective:     Vitals:   06/22/17 1015  BP: 128/77  Pulse: 80                Assessment:    [id number] Patient Active Problem List   Diagnosis Date Noted  . Vaginal discharge 06/08/2017  . HTN  (hypertension) 06/08/2017  . Postmenopausal bleeding 06/08/2017  . Allergic rhinitis 04/06/2017  . Encounter for general adult medical examination with abnormal findings 03/28/2016  . Skin lesion on examination 03/28/2016  . UTI (urinary tract infection) 06/28/2015  . Rash and nonspecific skin eruption 06/28/2015  . Obesity 04/03/2013  . Atrophic vaginitis 04/03/2013     1. Postmenopausal bleeding     Likely atrophic vaginitis.     Plan:            1.  I discussed postmenopausal bleeding with her in detail.  Work-up for bleeding to include ultrasound for endometrial thickness or endometrial biopsy.  The risks and benefits of each procedure were discussed in detail.  Because of her single episode of spotting she has elected to have the ultrasound to measure the endometrial thickness.  Should this be normal for menopause no further work-up is necessary.  At that point a small amount of vaginal estrogen may thicken the vagina and prevent further episodes of postmenopausal bleeding.  Should her ultrasound showed a thickened lining and endometrial biopsy would then be necessary of course.  She has a good understanding of this and has elected to have the ultrasound over opting for the endometrial biopsy today. Orders Orders Placed This Encounter  Procedures  . US PELVIS TRANSVANGINAL NON-OB (TV ONLY)    No orders of the defined types were placed in this encounter.     F/U  Return for Pt to contact us if symptoms worsen, We will contact her with any abnormal test results. I spent 33 minutes involved in the care of this patient of which greater than 50% was spent discussing postmenopausal bleeding causes and risk factors, life history and risk for endometrial hyperplasia, other causes of postmenopausal bleeding, treatment for postmenopausal bleeding.  Atrophic vaginitis, endometrial biopsy versus ultrasound for postmenopausal bleeding, possibility of endometrial hyperplasia versus atypical  endometrial hyperplasia versus endometrial cancer discussed in detail.  All questions answered.  Elonda Huskyavid J. Rontrell Moquin, M.D. 06/22/2017 10:58 AM

## 2017-06-22 NOTE — Progress Notes (Signed)
Patient is willing to try amlodipine 10 mg, for elevated diastolic pressure per PCP script sent to pharmacy, script sent to pharmacy and patient scheduled for one month follow up BP check with nurse. FYI

## 2017-06-29 ENCOUNTER — Ambulatory Visit (INDEPENDENT_AMBULATORY_CARE_PROVIDER_SITE_OTHER): Payer: BLUE CROSS/BLUE SHIELD

## 2017-06-29 DIAGNOSIS — N950 Postmenopausal bleeding: Secondary | ICD-10-CM

## 2017-07-25 ENCOUNTER — Ambulatory Visit (INDEPENDENT_AMBULATORY_CARE_PROVIDER_SITE_OTHER): Payer: BLUE CROSS/BLUE SHIELD | Admitting: *Deleted

## 2017-07-25 VITALS — BP 126/80 | HR 68 | Resp 18

## 2017-07-25 DIAGNOSIS — I10 Essential (primary) hypertension: Secondary | ICD-10-CM

## 2017-07-25 NOTE — Progress Notes (Signed)
Patient in for one month FU on BP after refilling her amlodipine 10 mg. Patient BP in left arm 126/80 Pulse 68.

## 2017-07-28 NOTE — Progress Notes (Signed)
Blood pressure is adequate.  Continue amlodipine.

## 2017-08-01 NOTE — Progress Notes (Signed)
Patient notified and voiced understanding.

## 2017-08-19 DIAGNOSIS — M25569 Pain in unspecified knee: Secondary | ICD-10-CM | POA: Diagnosis not present

## 2017-09-13 DIAGNOSIS — M62830 Muscle spasm of back: Secondary | ICD-10-CM | POA: Diagnosis not present

## 2017-09-13 DIAGNOSIS — M9903 Segmental and somatic dysfunction of lumbar region: Secondary | ICD-10-CM | POA: Diagnosis not present

## 2017-09-13 DIAGNOSIS — M5127 Other intervertebral disc displacement, lumbosacral region: Secondary | ICD-10-CM | POA: Diagnosis not present

## 2017-09-14 DIAGNOSIS — M9903 Segmental and somatic dysfunction of lumbar region: Secondary | ICD-10-CM | POA: Diagnosis not present

## 2017-09-14 DIAGNOSIS — M62830 Muscle spasm of back: Secondary | ICD-10-CM | POA: Diagnosis not present

## 2017-09-14 DIAGNOSIS — M5127 Other intervertebral disc displacement, lumbosacral region: Secondary | ICD-10-CM | POA: Diagnosis not present

## 2017-09-18 DIAGNOSIS — M9903 Segmental and somatic dysfunction of lumbar region: Secondary | ICD-10-CM | POA: Diagnosis not present

## 2017-09-18 DIAGNOSIS — M62830 Muscle spasm of back: Secondary | ICD-10-CM | POA: Diagnosis not present

## 2017-09-18 DIAGNOSIS — M5127 Other intervertebral disc displacement, lumbosacral region: Secondary | ICD-10-CM | POA: Diagnosis not present

## 2017-09-20 DIAGNOSIS — M62830 Muscle spasm of back: Secondary | ICD-10-CM | POA: Diagnosis not present

## 2017-09-20 DIAGNOSIS — M5127 Other intervertebral disc displacement, lumbosacral region: Secondary | ICD-10-CM | POA: Diagnosis not present

## 2017-09-20 DIAGNOSIS — M9903 Segmental and somatic dysfunction of lumbar region: Secondary | ICD-10-CM | POA: Diagnosis not present

## 2017-09-24 DIAGNOSIS — M62830 Muscle spasm of back: Secondary | ICD-10-CM | POA: Diagnosis not present

## 2017-09-24 DIAGNOSIS — M9903 Segmental and somatic dysfunction of lumbar region: Secondary | ICD-10-CM | POA: Diagnosis not present

## 2017-09-24 DIAGNOSIS — M5127 Other intervertebral disc displacement, lumbosacral region: Secondary | ICD-10-CM | POA: Diagnosis not present

## 2017-09-27 DIAGNOSIS — M5127 Other intervertebral disc displacement, lumbosacral region: Secondary | ICD-10-CM | POA: Diagnosis not present

## 2017-09-27 DIAGNOSIS — M9903 Segmental and somatic dysfunction of lumbar region: Secondary | ICD-10-CM | POA: Diagnosis not present

## 2017-09-27 DIAGNOSIS — M62830 Muscle spasm of back: Secondary | ICD-10-CM | POA: Diagnosis not present

## 2017-09-28 ENCOUNTER — Ambulatory Visit (INDEPENDENT_AMBULATORY_CARE_PROVIDER_SITE_OTHER): Payer: BLUE CROSS/BLUE SHIELD | Admitting: Family Medicine

## 2017-09-28 ENCOUNTER — Encounter: Payer: Self-pay | Admitting: Family Medicine

## 2017-09-28 VITALS — BP 136/82 | HR 67 | Temp 98.3°F | Ht 66.5 in | Wt 247.6 lb

## 2017-09-28 DIAGNOSIS — Z6839 Body mass index (BMI) 39.0-39.9, adult: Secondary | ICD-10-CM

## 2017-09-28 DIAGNOSIS — Z23 Encounter for immunization: Secondary | ICD-10-CM | POA: Diagnosis not present

## 2017-09-28 DIAGNOSIS — R319 Hematuria, unspecified: Secondary | ICD-10-CM | POA: Insufficient documentation

## 2017-09-28 DIAGNOSIS — I10 Essential (primary) hypertension: Secondary | ICD-10-CM

## 2017-09-28 DIAGNOSIS — F439 Reaction to severe stress, unspecified: Secondary | ICD-10-CM | POA: Insufficient documentation

## 2017-09-28 DIAGNOSIS — R310 Gross hematuria: Secondary | ICD-10-CM

## 2017-09-28 DIAGNOSIS — N950 Postmenopausal bleeding: Secondary | ICD-10-CM | POA: Diagnosis not present

## 2017-09-28 NOTE — Progress Notes (Signed)
  Anne AlarEric Keena Heesch, MD Phone: [phone]  Anne Ryan is a 57 y.o. female who presents today for f/u.  CC: htn, obesity, postmenopausal bleeding, hematuria  HYPERTENSION  Disease Monitoring  Home BP Monitoring not checking Chest pain- no    Dyspnea- no Medications  Compliance-  Taking amlodipine.  Edema- no  Obesity: She is very active on the weekends though no specific exercise.  She has been stress eating related to stress at work.  She is snacking.  One diet soda daily.  No sweet tea.  Stress: Notes this is related to work issues.  Some depression depending on the day.  No SI.  Some anxiety from work.  She does have access to a counselor at work.  Postmenopausal bleeding: She saw GYN.  They did an ultrasound that was unremarkable for cause.  She has noted a few pinkish tinges in her underwear at times though she is unclear if that is related to vaginal bleeding or from her urine as she has had some hematuria.  She has an appointment with urology.  She notes no urinary symptoms now.    Social History   Tobacco Use  Smoking Status Former Smoker  Smokeless Tobacco Never Used     ROS see history of present illness  Objective  Physical Exam Vitals:   09/28/17 1054  BP: 136/82  Pulse: 67  Temp: 98.3 F (36.8 C)  SpO2: 98%    BP Readings from Last 3 Encounters:  09/28/17 136/82  07/25/17 126/80  06/22/17 128/77   Wt Readings from Last 3 Encounters:  09/28/17 247 lb 9.6 oz (112.3 kg)  06/22/17 243 lb 4.8 oz (110.4 kg)  06/08/17 249 lb 6.4 oz (113.1 kg)    Physical Exam  Constitutional: No distress.  Cardiovascular: Normal rate, regular rhythm and normal heart sounds.  Pulmonary/Chest: Effort normal and breath sounds normal.  Musculoskeletal: She exhibits no edema.  Neurological: She is alert.  Skin: Skin is warm and dry. She is not diaphoretic.     Assessment/Plan: Please see individual problem list.  Postmenopausal bleeding Patient had evaluation through  GYN.  Unclear if what she describes now is related to a urinary issue or vaginal issue.  Discussed returning to GYN to discuss further she wants to defer this until after she sees urology.  We will send this note to her gynecologist to make them aware.  Obesity She will work on diet and exercise  HTN (hypertension) Above goal.  Patient is not checking at home.  She will buy a blood pressure cuff and start checking at home.  She will return in 3 to 4 weeks for BP check with nursing and bring her readings in.  Hematuria Possibly related to UTI previously urinary symptoms have resolved.  She was unable to provide a urine sample today.  She does have an appointment with urology in the next several weeks.  She will keep that appointment.  Stress Related to work.  Discussed seeing the counselor at work.  She does not want medication she would take daily.  If symptoms worsen with depression or anxiety she will let us know.    Orders Placed This Encounter  Procedures  . Flu Vaccine QUAD 6+ mos PF IM (Fluarix Quad PF)    No orders of the defined types were placed in this encounter.    Anne AlarEric Rollo Farquhar, MD [facility] [place]

## 2017-09-28 NOTE — Assessment & Plan Note (Signed)
Related to work.  Discussed seeing the counselor at work.  She does not want medication she would take daily.  If symptoms worsen with depression or anxiety she will let us know.

## 2017-09-28 NOTE — Patient Instructions (Signed)
Nice to see you. Please pick up a blood pressure cuff. Omron is a good brand.  Please check your blood pressure daily.  We will have you return in several weeks for BP check.  Please bring your readings in. Please try to work on diet and exercise. Please monitor your stress and if you develop worsening depression or anxiety please let us know. Please keep your appointment with urology.  Once you have seen them please touch base with your gynecologist as well.

## 2017-09-28 NOTE — Assessment & Plan Note (Signed)
Possibly related to UTI previously urinary symptoms have resolved.  She was unable to provide a urine sample today.  She does have an appointment with urology in the next several weeks.  She will keep that appointment.

## 2017-09-28 NOTE — Assessment & Plan Note (Signed)
Above goal.  Patient is not checking at home.  She will buy a blood pressure cuff and start checking at home.  She will return in 3 to 4 weeks for BP check with nursing and bring her readings in.

## 2017-09-28 NOTE — Assessment & Plan Note (Addendum)
Patient had evaluation through GYN.  Unclear if what she describes now is related to a urinary issue or vaginal issue.  Discussed returning to GYN to discuss further she wants to defer this until after she sees urology.  We will send this note to her gynecologist to make them aware.

## 2017-09-28 NOTE — Assessment & Plan Note (Signed)
She will work on diet and exercise. 

## 2017-10-02 DIAGNOSIS — M62830 Muscle spasm of back: Secondary | ICD-10-CM | POA: Diagnosis not present

## 2017-10-02 DIAGNOSIS — M9903 Segmental and somatic dysfunction of lumbar region: Secondary | ICD-10-CM | POA: Diagnosis not present

## 2017-10-02 DIAGNOSIS — M5127 Other intervertebral disc displacement, lumbosacral region: Secondary | ICD-10-CM | POA: Diagnosis not present

## 2017-10-04 DIAGNOSIS — M62830 Muscle spasm of back: Secondary | ICD-10-CM | POA: Diagnosis not present

## 2017-10-04 DIAGNOSIS — M9903 Segmental and somatic dysfunction of lumbar region: Secondary | ICD-10-CM | POA: Diagnosis not present

## 2017-10-04 DIAGNOSIS — M5127 Other intervertebral disc displacement, lumbosacral region: Secondary | ICD-10-CM | POA: Diagnosis not present

## 2017-10-08 DIAGNOSIS — M62830 Muscle spasm of back: Secondary | ICD-10-CM | POA: Diagnosis not present

## 2017-10-08 DIAGNOSIS — M5127 Other intervertebral disc displacement, lumbosacral region: Secondary | ICD-10-CM | POA: Diagnosis not present

## 2017-10-08 DIAGNOSIS — M9903 Segmental and somatic dysfunction of lumbar region: Secondary | ICD-10-CM | POA: Diagnosis not present

## 2017-10-09 DIAGNOSIS — M62830 Muscle spasm of back: Secondary | ICD-10-CM | POA: Diagnosis not present

## 2017-10-09 DIAGNOSIS — M9903 Segmental and somatic dysfunction of lumbar region: Secondary | ICD-10-CM | POA: Diagnosis not present

## 2017-10-09 DIAGNOSIS — M5127 Other intervertebral disc displacement, lumbosacral region: Secondary | ICD-10-CM | POA: Diagnosis not present

## 2017-10-16 ENCOUNTER — Ambulatory Visit (INDEPENDENT_AMBULATORY_CARE_PROVIDER_SITE_OTHER): Payer: BLUE CROSS/BLUE SHIELD | Admitting: *Deleted

## 2017-10-16 VITALS — BP 130/84 | HR 78 | Resp 16

## 2017-10-16 DIAGNOSIS — Z0130 Encounter for examination of blood pressure without abnormal findings: Secondary | ICD-10-CM

## 2017-10-17 NOTE — Progress Notes (Signed)
Patient here for nurse visit BP check per order from 09/28/17  Patient reports compliance with prescribed BP medications: yes  Last dose of BP medication: Amlodipine 10 mg this morning  BP Readings from Last 3 Encounters:  10/16/17 128/84  09/28/17 136/82  07/25/17 126/80   Pulse Readings from Last 3 Encounters:  10/16/17 79  09/28/17 67  07/25/17 68      Patient verbalized understanding of instructions.   Henrene Pastor, LPN

## 2017-10-21 NOTE — Progress Notes (Signed)
Her blood pressure is adequately controlled.  Continue with amlodipine.  She should monitor at home and if it trends up let us know.

## 2017-10-23 ENCOUNTER — Telehealth: Payer: Self-pay | Admitting: *Deleted

## 2017-10-23 DIAGNOSIS — N3020 Other chronic cystitis without hematuria: Secondary | ICD-10-CM | POA: Diagnosis not present

## 2017-10-23 DIAGNOSIS — B9620 Unspecified Escherichia coli [E. coli] as the cause of diseases classified elsewhere: Secondary | ICD-10-CM | POA: Diagnosis not present

## 2017-10-23 DIAGNOSIS — R8271 Bacteriuria: Secondary | ICD-10-CM | POA: Diagnosis not present

## 2017-10-23 DIAGNOSIS — N390 Urinary tract infection, site not specified: Secondary | ICD-10-CM | POA: Diagnosis not present

## 2017-10-23 NOTE — Progress Notes (Signed)
Mychart message sent and letter

## 2017-11-06 DIAGNOSIS — N3020 Other chronic cystitis without hematuria: Secondary | ICD-10-CM | POA: Diagnosis not present

## 2017-11-23 NOTE — Telephone Encounter (Signed)
scheduled

## 2017-12-22 ENCOUNTER — Other Ambulatory Visit: Payer: Self-pay | Admitting: Family Medicine

## 2018-01-11 ENCOUNTER — Encounter: Payer: Self-pay | Admitting: Family Medicine

## 2018-01-11 ENCOUNTER — Ambulatory Visit (INDEPENDENT_AMBULATORY_CARE_PROVIDER_SITE_OTHER): Payer: BLUE CROSS/BLUE SHIELD | Admitting: Family Medicine

## 2018-01-11 VITALS — BP 118/70 | HR 61 | Temp 98.0°F | Ht 66.5 in | Wt 256.8 lb

## 2018-01-11 DIAGNOSIS — I10 Essential (primary) hypertension: Secondary | ICD-10-CM | POA: Diagnosis not present

## 2018-01-11 DIAGNOSIS — N950 Postmenopausal bleeding: Secondary | ICD-10-CM

## 2018-01-11 DIAGNOSIS — J309 Allergic rhinitis, unspecified: Secondary | ICD-10-CM

## 2018-01-11 DIAGNOSIS — M79651 Pain in right thigh: Secondary | ICD-10-CM

## 2018-01-11 DIAGNOSIS — R310 Gross hematuria: Secondary | ICD-10-CM

## 2018-01-11 DIAGNOSIS — R300 Dysuria: Secondary | ICD-10-CM | POA: Diagnosis not present

## 2018-01-11 NOTE — Progress Notes (Signed)
Marikay Alar, MD Phone: [phone]  Anne Ryan is a 58 y.o. female who presents today for follow-up.  CC: Hypertension, allergic rhinitis, hematuria, postmenopausal bleeding, right thigh pain  Hypertension: Typically running 110-120s/70s-80s.  Taking amlodipine.  No chest pain or shortness of breath.  She does note some rare foot edema when she has urinary tract symptoms.  Allergic rhinitis: She is taking her Flonase.  She also takes Claritin.  States this is well controlled.  Hematuria: She saw urology.  They placed her on an estrogen cream and prophylactic Macrobid.  She has not had any gross hematuria.  She does note some mild dysuria this week with frequency and urgency.  No fevers.  Minimal suprapubic discomfort.  No vaginal discharge.  Postmenopausal bleeding: She underwent evaluation with GYN.  She had an ultrasound that was reassuring.  She has had no additional bleeding.  Right thigh pain: She notes she developed some medial right thigh pain without a significant inciting factor.  She notes this has improved at this point.  She noted no rash.  No injury.  Social History   Tobacco Use  Smoking Status Former Smoker  Smokeless Tobacco Never Used     ROS see history of present illness  Objective  Physical Exam Vitals:   01/11/18 1531  BP: 118/70  Pulse: 61  Temp: 98 F (36.7 C)  SpO2: 98%    BP Readings from Last 3 Encounters:  01/11/18 118/70  10/17/17 130/84  09/28/17 136/82   Wt Readings from Last 3 Encounters:  01/11/18 256 lb 12.8 oz (116.5 kg)  09/28/17 247 lb 9.6 oz (112.3 kg)  06/22/17 243 lb 4.8 oz (110.4 kg)    Physical Exam Constitutional:      General: She is not in acute distress.    Appearance: She is not diaphoretic.  Cardiovascular:     Rate and Rhythm: Normal rate and regular rhythm.     Heart sounds: Normal heart sounds.  Pulmonary:     Effort: Pulmonary effort is normal.     Breath sounds: Normal breath sounds.  Abdominal:    General: Bowel sounds are normal. There is no distension.     Palpations: Abdomen is soft.     Tenderness: There is no abdominal tenderness.  Musculoskeletal:     Right lower leg: No edema.     Left lower leg: No edema.     Comments: Chaperone used, right thigh palpated without her pants on revealing no abnormalities, no rash noted, no tenderness  Skin:    General: Skin is warm and dry.  Neurological:     Mental Status: She is alert.      Assessment/Plan: Please see individual problem list.  HTN (hypertension) Well-controlled.  Continue current regimen.  Allergic rhinitis Well-controlled.  Continue current regimen.  Postmenopausal bleeding No recurrence.  She will continue management through GYN.  Hematuria No recurrence.  She has been evaluated by urology.  She does have some UTI symptoms today.  Her urine was to be checked though it does not appear that her urine sample was tested at the time of her visit.  I will have the CMA contact the patient on Monday to get her into the office to check her urine.  Right thigh pain Undetermined cause.  This has improved on its own.  She will monitor for recurrence.   Orders Placed This Encounter  Procedures  . POCT Urinalysis Dipstick    Standing Status:   Future    Standing Expiration Date:  03/13/2018    No orders of the defined types were placed in this encounter.    Marikay Alar, MD [facility]

## 2018-01-11 NOTE — Patient Instructions (Signed)
Nice to see you. We will check your urine and contact you with the results.

## 2018-01-12 ENCOUNTER — Other Ambulatory Visit: Payer: Self-pay | Admitting: Family Medicine

## 2018-01-12 ENCOUNTER — Telehealth: Payer: Self-pay | Admitting: Family Medicine

## 2018-01-12 DIAGNOSIS — M79651 Pain in right thigh: Secondary | ICD-10-CM | POA: Insufficient documentation

## 2018-01-12 NOTE — Assessment & Plan Note (Signed)
Undetermined cause.  This has improved on its own.  She will monitor for recurrence.

## 2018-01-12 NOTE — Assessment & Plan Note (Signed)
Well-controlled.  Continue current regimen. 

## 2018-01-12 NOTE — Telephone Encounter (Signed)
It appears this patient urine was not run as a dipstick on Friday.  Please contact her to see if she can come back to the office to provide another urine sample.  Thanks.

## 2018-01-12 NOTE — Assessment & Plan Note (Addendum)
No recurrence.  She has been evaluated by urology.  She does have some UTI symptoms today.  Her urine was to be checked though it does not appear that her urine sample was tested at the time of her visit.  I will have the CMA contact the patient on Monday to get her into the office to check her urine.

## 2018-01-12 NOTE — Assessment & Plan Note (Signed)
No recurrence.  She will continue management through GYN.

## 2018-01-14 MED ORDER — AMLODIPINE BESYLATE 10 MG PO TABS: 10.0000 mg | ORAL_TABLET | Freq: Every day | ORAL | 0 refills | Status: DC

## 2018-01-14 NOTE — Telephone Encounter (Signed)
Called and spoke with patient. Pt stated that she could come in sometime tomorrow to drop off a sample does she need to be schedule for lab appt?

## 2018-01-14 NOTE — Telephone Encounter (Signed)
She can be placed on schedule once she walks in.

## 2018-01-30 DIAGNOSIS — Z1231 Encounter for screening mammogram for malignant neoplasm of breast: Secondary | ICD-10-CM | POA: Diagnosis not present

## 2018-04-06 ENCOUNTER — Other Ambulatory Visit: Payer: Self-pay | Admitting: Family Medicine

## 2018-04-16 ENCOUNTER — Other Ambulatory Visit: Payer: Self-pay | Admitting: Family Medicine

## 2018-07-11 ENCOUNTER — Other Ambulatory Visit: Payer: Self-pay | Admitting: Family Medicine

## 2018-07-19 ENCOUNTER — Encounter: Payer: Self-pay | Admitting: Family Medicine

## 2018-07-19 ENCOUNTER — Ambulatory Visit (INDEPENDENT_AMBULATORY_CARE_PROVIDER_SITE_OTHER): Payer: BC Managed Care – PPO | Admitting: Family Medicine

## 2018-07-19 ENCOUNTER — Other Ambulatory Visit: Payer: Self-pay

## 2018-07-19 DIAGNOSIS — Z6839 Body mass index (BMI) 39.0-39.9, adult: Secondary | ICD-10-CM

## 2018-07-19 DIAGNOSIS — J309 Allergic rhinitis, unspecified: Secondary | ICD-10-CM

## 2018-07-19 DIAGNOSIS — I10 Essential (primary) hypertension: Secondary | ICD-10-CM | POA: Diagnosis not present

## 2018-07-19 MED ORDER — AMLODIPINE BESYLATE 10 MG PO TABS: 10.0000 mg | ORAL_TABLET | Freq: Every day | ORAL | 1 refills | Status: DC

## 2018-07-19 NOTE — Progress Notes (Signed)
Virtual Visit via video Note  This visit type was conducted due to national recommendations for restrictions regarding the COVID-19 pandemic (e.g. social distancing).  This format is felt to be most appropriate for this patient at this time.  All issues noted in this document were discussed and addressed.  No physical exam was performed (except for noted visual exam findings with Video Visits).   I connected with Anne Ryan today at  2:45 PM EDT by a video enabled telemedicine application and verified that I am speaking with the correct person using two identifiers. Location patient: home Location provider: work Persons participating in the virtual visit: patient, provider  I discussed the limitations, risks, security and privacy concerns of performing an evaluation and management service by telephone and the availability of in person appointments. I also discussed with the patient that there may be a patient responsible charge related to this service. The patient expressed understanding and agreed to proceed.   Reason for visit: follow-up  HPI: HYPERTENSION  Disease Monitoring  Home BP Monitoring 115-120/78-84 Chest pain- no    Dyspnea- no Medications  Compliance-  Taking amlodipine.   Edema- no  Obesity: Patient has been doing some yard work with mowing the grass and cutting trees though no specific exercise otherwise.  Diet does consist of fruits and vegetables and meats.  She does eat some sweets and junk food.  No sugary drinks.  Allergic rhinitis: Relatively stable with occasional sneezing and rhinorrhea that typically occurs when she is going outside though also occurs in the morning.  She uses Claritin.  Uses Flonase as needed.  No fevers.  She does not feel ill.  No COVID exposure.    ROS: See pertinent positives and negatives per HPI.  Past Medical History:  Diagnosis Date  . Chickenpox   . History of recurrent UTIs     Past Surgical History:  Procedure Laterality  Date  . BLADDER SURGERY  1968  . CARPAL TUNNEL RELEASE    . TONSILLECTOMY  1986  . TRANSVAGINAL TAPE (TVT) REMOVAL      Family History  Problem Relation Age of Onset  . Brain cancer Mother   . Heart disease Mother   . Hyperlipidemia Mother   . Hypertension Mother   . Diabetes Mother   . Cirrhosis Father   . Alcoholism Father   . Diabetes Sister   . Hypertension Sister   . Sleep apnea Sister   . Breast cancer Neg Hx   . Ovarian cancer Neg Hx   . Colon cancer Neg Hx     SOCIAL HX: Former smoker   Current Outpatient Medications:  .  amLODipine (NORVASC) 10 MG tablet, Take 1 tablet (10 mg total) by mouth daily., Disp: 90 tablet, Rfl: 1 .  Cranberry 500 MG TABS, Take by mouth., Disp: , Rfl:  .  Cyanocobalamin (VITAMIN B 12 PO), Take by mouth., Disp: , Rfl:  .  fluticasone (FLONASE) 50 MCG/ACT nasal spray, Place 2 sprays into both nostrils daily., Disp: 16 g, Rfl: 6 .  loratadine (CLARITIN) 10 MG tablet, TAKE 1 TABLET BY MOUTH DAILY, Disp: 30 tablet, Rfl: 3 .  Multiple Vitamin (THERA) TABS, Take by mouth., Disp: , Rfl:  .  NON FORMULARY, Estradiol HRT (EMP) 0.02% cream use a small amount and apply to vaginal area at bedtime., Disp: , Rfl:  .  Omega-3 Fatty Acids (FISH OIL) 1000 MG CAPS, Take by mouth., Disp: , Rfl:   EXAM:  VITALS per patient if  applicable: None.  GENERAL: alert, oriented, appears well and in no acute distress  HEENT: atraumatic, conjunttiva clear, no obvious abnormalities on inspection of external nose and ears  NECK: normal movements of the head and neck  LUNGS: on inspection no signs of respiratory distress, breathing rate appears normal, no obvious gross SOB, gasping or wheezing  CV: no obvious cyanosis  MS: moves all visible extremities without noticeable abnormality  PSYCH/NEURO: pleasant and cooperative, no obvious depression or anxiety, speech and thought processing grossly intact  ASSESSMENT AND PLAN:  Discussed the following assessment and  plan:  HTN (hypertension) Adequately controlled.  She did ask about coming off of BP medications and I noted I would like to consistently see her diastolic blood pressure less than 80 prior to considering coming off of medication.  Refill sent to pharmacy.  She will come in for lab work.  Allergic rhinitis Stable.  Continue current regimen.  Obesity I encouraged her to add in some activity around the house and to cut down on sweets and junk food.   Social distancing precautions and sick precautions given regarding COVID-19.   I discussed the assessment and treatment plan with the patient. The patient was provided an opportunity to ask questions and all were answered. The patient agreed with the plan and demonstrated an understanding of the instructions.   The patient was advised to call back or seek an in-person evaluation if the symptoms worsen or if the condition fails to improve as anticipated.    Marikay AlarEric Sonnenberg, MD

## 2018-07-19 NOTE — Assessment & Plan Note (Signed)
Adequately controlled.  She did ask about coming off of BP medications and I noted I would like to consistently see her diastolic blood pressure less than 80 prior to considering coming off of medication.  Refill sent to pharmacy.  She will come in for lab work.

## 2018-07-19 NOTE — Assessment & Plan Note (Signed)
Stable  Continue current regimen  

## 2018-07-19 NOTE — Assessment & Plan Note (Signed)
I encouraged her to add in some activity around the house and to cut down on sweets and junk food.

## 2018-08-23 ENCOUNTER — Other Ambulatory Visit: Payer: BC Managed Care – PPO

## 2018-08-27 ENCOUNTER — Other Ambulatory Visit: Payer: Self-pay | Admitting: Family Medicine

## 2018-09-05 ENCOUNTER — Other Ambulatory Visit (INDEPENDENT_AMBULATORY_CARE_PROVIDER_SITE_OTHER): Payer: BC Managed Care – PPO

## 2018-09-05 ENCOUNTER — Other Ambulatory Visit: Payer: Self-pay

## 2018-09-05 DIAGNOSIS — Z6839 Body mass index (BMI) 39.0-39.9, adult: Secondary | ICD-10-CM

## 2018-09-05 DIAGNOSIS — I10 Essential (primary) hypertension: Secondary | ICD-10-CM | POA: Diagnosis not present

## 2018-09-05 LAB — LIPID PANEL
Cholesterol: 202 mg/dL — ABNORMAL HIGH (ref 0–200)
HDL: 44.3 mg/dL (ref >39.00)
LDL Cholesterol: 131 mg/dL — ABNORMAL HIGH (ref 0–99)
NonHDL: 157.28
Total CHOL/HDL Ratio: 5
Triglycerides: 133 mg/dL (ref 0.0–149.0)
VLDL: 26.6 mg/dL (ref 0.0–40.0)

## 2018-09-05 LAB — COMPREHENSIVE METABOLIC PANEL
ALT: 34 U/L (ref 0–35)
AST: 26 U/L (ref 0–37)
Albumin: 4.6 g/dL (ref 3.5–5.2)
Alkaline Phosphatase: 75 U/L (ref 39–117)
BUN: 21 mg/dL (ref 6–23)
CO2: 29 mEq/L (ref 19–32)
Calcium: 9.6 mg/dL (ref 8.4–10.5)
Chloride: 104 mEq/L (ref 96–112)
Creatinine, Ser: 0.83 mg/dL (ref 0.40–1.20)
GFR: 70.94 mL/min (ref >60.00)
Glucose, Bld: 100 mg/dL — ABNORMAL HIGH (ref 70–99)
Potassium: 4.3 mEq/L (ref 3.5–5.1)
Sodium: 141 mEq/L (ref 135–145)
Total Bilirubin: 0.9 mg/dL (ref 0.2–1.2)
Total Protein: 7.3 g/dL (ref 6.0–8.3)

## 2018-09-05 LAB — HEMOGLOBIN A1C: Hgb A1c MFr Bld: 5.8 % (ref 4.6–6.5)

## 2019-01-24 ENCOUNTER — Ambulatory Visit (INDEPENDENT_AMBULATORY_CARE_PROVIDER_SITE_OTHER): Payer: BC Managed Care – PPO | Admitting: Family Medicine

## 2019-01-24 ENCOUNTER — Encounter: Payer: Self-pay | Admitting: Family Medicine

## 2019-01-24 ENCOUNTER — Other Ambulatory Visit: Payer: Self-pay

## 2019-01-24 DIAGNOSIS — R7303 Prediabetes: Secondary | ICD-10-CM | POA: Diagnosis not present

## 2019-01-24 DIAGNOSIS — I10 Essential (primary) hypertension: Secondary | ICD-10-CM | POA: Diagnosis not present

## 2019-01-24 DIAGNOSIS — Z6839 Body mass index (BMI) 39.0-39.9, adult: Secondary | ICD-10-CM

## 2019-01-24 MED ORDER — AMLODIPINE BESYLATE 10 MG PO TABS: 10.0000 mg | ORAL_TABLET | Freq: Every day | ORAL | 1 refills | Status: DC

## 2019-01-24 NOTE — Assessment & Plan Note (Signed)
Noted on recent lab work.  Encouraged continued exercise and monitoring her diet.  Advised to monitor for any polyuria or polydipsia.

## 2019-01-24 NOTE — Assessment & Plan Note (Signed)
Continue exercise and monitoring diet.

## 2019-01-24 NOTE — Assessment & Plan Note (Addendum)
Adequately controlled though has trended up slightly.  She will start checking daily for the next 2 weeks and then send [place] her readings.  She will continue with her amlodipine.  Discussed potential for adding additional medication if her blood pressure does trend up.

## 2019-01-24 NOTE — Progress Notes (Signed)
Virtual Visit via video Note  This visit type was conducted due to national recommendations for restrictions regarding the COVID-19 pandemic (e.g. social distancing).  This format is felt to be most appropriate for this patient at this time.  All issues noted in this document were discussed and addressed.  No physical exam was performed (except for noted visual exam findings with Video Visits).   I connected with Anne Ryan today at  2:45 PM EST by a video enabled telemedicine application and verified that I am speaking with the correct person using two identifiers. Location patient: home Location provider: work  Persons participating in the virtual visit: patient, provider  I discussed the limitations, risks, security and privacy concerns of performing an evaluation and management service by telephone and the availability of in person appointments. I also discussed with the patient that there may be a patient responsible charge related to this service. The patient expressed understanding and agreed to proceed.   Reason for visit: follow-up  HPI: Hypertension: She had not been checking until recently.  Has ranged anywhere from 123-146/81-89.  Mostly has been less than 140 systolically.  She is taking amlodipine.  No chest pain, shortness of breath, or edema.  Obesity: Patient notes she has been exercising daily for 30 minutes.  She is eating a normal diet with a variety of meats and fruits and vegetables.  She has been eating some mixed nuts.  Not too many types of junk food or salty foods.  Prediabetes: No polyuria or polydipsia.   ROS: See pertinent positives and negatives per HPI.  Past Medical History:  Diagnosis Date  . Chickenpox   . History of recurrent UTIs     Past Surgical History:  Procedure Laterality Date  . BLADDER SURGERY  1968  . CARPAL TUNNEL RELEASE    . TONSILLECTOMY  1986  . TRANSVAGINAL TAPE (TVT) REMOVAL      Family History  Problem Relation Age of  Onset  . Brain cancer Mother   . Heart disease Mother   . Hyperlipidemia Mother   . Hypertension Mother   . Diabetes Mother   . Cirrhosis Father   . Alcoholism Father   . Diabetes Sister   . Hypertension Sister   . Sleep apnea Sister   . Breast cancer Neg Hx   . Ovarian cancer Neg Hx   . Colon cancer Neg Hx     SOCIAL HX: Former smoker.   Current Outpatient Medications:  .  amLODipine (NORVASC) 10 MG tablet, Take 1 tablet (10 mg total) by mouth daily., Disp: 90 tablet, Rfl: 1 .  Cyanocobalamin (VITAMIN B 12 PO), Take by mouth., Disp: , Rfl:  .  fluticasone (FLONASE) 50 MCG/ACT nasal spray, Place 2 sprays into both nostrils daily., Disp: 16 g, Rfl: 6 .  loratadine (CLARITIN) 10 MG tablet, TAKE 1 TABLET BY MOUTH DAILY, Disp: 30 tablet, Rfl: 3 .  Multiple Vitamin (THERA) TABS, Take by mouth., Disp: , Rfl:  .  Omega-3 Fatty Acids (FISH OIL) 1000 MG CAPS, Take by mouth., Disp: , Rfl:  .  Cranberry 500 MG TABS, Take by mouth., Disp: , Rfl:  .  NON FORMULARY, Estradiol HRT (EMP) 0.02% cream use a small amount and apply to vaginal area at bedtime., Disp: , Rfl:   EXAM:  VITALS per patient if applicable:  GENERAL: alert, oriented, appears well and in no acute distress  HEENT: atraumatic, conjunttiva clear, no obvious abnormalities on inspection of external nose and ears  NECK: normal movements of the head and neck  LUNGS: on inspection no signs of respiratory distress, breathing rate appears normal, no obvious gross SOB, gasping or wheezing  CV: no obvious cyanosis  MS: moves all visible extremities without noticeable abnormality  PSYCH/NEURO: pleasant and cooperative, no obvious depression or anxiety, speech and thought processing grossly intact  ASSESSMENT AND PLAN:  Discussed the following assessment and plan:  HTN (hypertension) Adequately controlled though has trended up slightly.  She will start checking daily for the next 2 weeks and then send [place] her readings.  She  will continue with her amlodipine.  Discussed potential for adding additional medication if her blood pressure does trend up.  Obesity Continue exercise and monitoring diet.  Prediabetes Noted on recent lab work.  Encouraged continued exercise and monitoring her diet.  Advised to monitor for any polyuria or polydipsia.   No orders of the defined types were placed in this encounter.   Meds ordered this encounter  Medications  . amLODipine (NORVASC) 10 MG tablet    Sig: Take 1 tablet (10 mg total) by mouth daily.    Dispense:  90 tablet    Refill:  1     I discussed the assessment and treatment plan with the patient. The patient was provided an opportunity to ask questions and all were answered. The patient agreed with the plan and demonstrated an understanding of the instructions.   The patient was advised to call back or seek an in-person evaluation if the symptoms worsen or if the condition fails to improve as anticipated.   Tommi Rumps, MD

## 2019-01-29 ENCOUNTER — Other Ambulatory Visit: Payer: Self-pay | Admitting: Family Medicine

## 2019-02-01 ENCOUNTER — Encounter: Payer: Self-pay | Admitting: Family Medicine

## 2019-05-30 ENCOUNTER — Ambulatory Visit (INDEPENDENT_AMBULATORY_CARE_PROVIDER_SITE_OTHER): Payer: BC Managed Care – PPO | Admitting: Family Medicine

## 2019-05-30 ENCOUNTER — Other Ambulatory Visit: Payer: Self-pay

## 2019-05-30 VITALS — BP 130/80 | HR 89 | Temp 97.3°F | Ht 67.0 in | Wt 253.8 lb

## 2019-05-30 DIAGNOSIS — Z1231 Encounter for screening mammogram for malignant neoplasm of breast: Secondary | ICD-10-CM | POA: Diagnosis not present

## 2019-05-30 DIAGNOSIS — I10 Essential (primary) hypertension: Secondary | ICD-10-CM

## 2019-05-30 DIAGNOSIS — Z6839 Body mass index (BMI) 39.0-39.9, adult: Secondary | ICD-10-CM

## 2019-05-30 NOTE — Assessment & Plan Note (Signed)
Adequately controlled.  Continue current regimen. 

## 2019-05-30 NOTE — Assessment & Plan Note (Signed)
Continue to exercise.  Discussed dietary changes.  Given dietary guidelines.

## 2019-05-30 NOTE — Patient Instructions (Signed)
Nice to see you. Please continue to work on exercise.  Please work on [employer].  Please work on cutting down on sodas.  Diet Recommendations  Starchy (carb) foods: Bread, rice, pasta, potatoes, corn, cereal, grits, crackers, bagels, muffins, all baked goods.  (Fruits, milk, and yogurt also have carbohydrate, but most of these foods will not spike your blood sugar as the starchy foods will.)  A few fruits do cause high blood sugars; use small portions of bananas (limit to 1/2 at a time), grapes, watermelon, oranges, and most tropical fruits.    Protein foods: Meat, fish, poultry, eggs, dairy foods, and beans such as pinto and kidney beans (beans also provide carbohydrate).   1. Eat at least 3 meals and 1-2 snacks per day. Never go more than 4-5 hours while awake without eating. Eat breakfast within the first hour of getting up.   2. Limit starchy foods to TWO per meal and ONE per snack. ONE portion of a starchy  food is equal to the following:   - ONE slice of bread (or its equivalent, such as half of a hamburger bun).   - 1/2 cup of a "scoopable" starchy food such as potatoes or rice.   - 15 grams of carbohydrate as shown on food label.  3. Include at every meal: a protein food, a carb food, and vegetables and/or fruit.   - Obtain twice the volume of veg's as protein or carbohydrate foods for both lunch and dinner.   - Fresh or frozen veg's are best.   - Keep frozen veg's on hand for a quick vegetable serving.

## 2019-05-30 NOTE — Progress Notes (Signed)
  Marikay Alar, MD Phone: [phone]  Anne Ryan is a 59 y.o. female who presents today for f/u.  HYPERTENSION  Disease Monitoring  Home BP Monitoring similar to today. Chest pain-no    dyspnea-.  No Medications  Compliance-.  Taking amlodipine.  Edema-no.  Obesity: Exercises 30 minutes a day.  Has been eating a variety of things.  Tries to limit junk food and sweets.  Does have diet soda.  Typically gets vegetables and meat with dinner.  Social History   Tobacco Use  Smoking Status Former Smoker  Smokeless Tobacco Never Used     ROS see history of present illness  Objective  Physical Exam Vitals:   05/30/19 1503  BP: 130/80  Pulse: 89  Temp: (!) 97.3 F (36.3 C)  SpO2: 97%    BP Readings from Last 3 Encounters:  05/30/19 130/80  01/24/19 132/83  01/11/18 118/70   Wt Readings from Last 3 Encounters:  05/30/19 253 lb 12.8 oz (115.1 kg)  01/24/19 253 lb (114.8 kg)  01/11/18 256 lb 12.8 oz (116.5 kg)    Physical Exam Constitutional:      General: She is not in acute distress.    Appearance: She is not diaphoretic.  Cardiovascular:     Rate and Rhythm: Normal rate and regular rhythm.     Heart sounds: Normal heart sounds.  Pulmonary:     Effort: Pulmonary effort is normal.     Breath sounds: Normal breath sounds.  Musculoskeletal:     Right lower leg: No edema.     Left lower leg: No edema.  Skin:    General: Skin is warm and dry.  Neurological:     Mental Status: She is alert.      Assessment/Plan: Please see individual problem list.  HTN (hypertension) Adequately controlled.  Continue current regimen.  Obesity Continue to exercise.  Discussed dietary changes.  Given dietary guidelines.   Health Maintenance: Mammogram ordered.  Orders Placed This Encounter  Procedures  . MM 3D SCREEN BREAST BILATERAL    Standing Status:   Future    Standing Expiration Date:   05/29/2020    Order Specific Question:   Reason for Exam (SYMPTOM  OR  DIAGNOSIS REQUIRED)    Answer:   breast cancer screening    Order Specific Question:   Is the patient pregnant?    Answer:   No    Order Specific Question:   Preferred imaging location?    Answer:   [facility]    No orders of the defined types were placed in this encounter.   This visit occurred during the SARS-CoV-2 public health emergency.  Safety protocols were in place, including screening questions prior to the visit, additional usage of staff PPE, and extensive cleaning of exam room while observing appropriate contact time as indicated for disinfecting solutions.    Marikay Alar, MD [facility]

## 2019-06-18 ENCOUNTER — Ambulatory Visit
Admission: RE | Admit: 2019-06-18 | Discharge: 2019-06-18 | Disposition: A | Discharge status: Home / Self Care | Payer: BC Managed Care – PPO | Source: Ambulatory Visit | Attending: Family Medicine | Admitting: Family Medicine

## 2019-06-18 DIAGNOSIS — Z1231 Encounter for screening mammogram for malignant neoplasm of breast: Secondary | ICD-10-CM | POA: Insufficient documentation

## 2019-09-09 ENCOUNTER — Other Ambulatory Visit: Payer: Self-pay | Admitting: Family Medicine

## 2019-10-03 ENCOUNTER — Encounter: Payer: Self-pay | Admitting: Family Medicine

## 2019-10-03 ENCOUNTER — Ambulatory Visit (INDEPENDENT_AMBULATORY_CARE_PROVIDER_SITE_OTHER): Payer: BC Managed Care – PPO | Admitting: Family Medicine

## 2019-10-03 ENCOUNTER — Other Ambulatory Visit: Payer: Self-pay

## 2019-10-03 VITALS — BP 120/80 | HR 79 | Temp 98.9°F | Ht 67.0 in | Wt 249.6 lb

## 2019-10-03 DIAGNOSIS — R7303 Prediabetes: Secondary | ICD-10-CM

## 2019-10-03 DIAGNOSIS — I10 Essential (primary) hypertension: Secondary | ICD-10-CM

## 2019-10-03 NOTE — Patient Instructions (Signed)
Nice to see you. We will get lab work today and contact you with results. Please try to cut down on sweets and carbs. Please continue to exercise.

## 2019-10-03 NOTE — Assessment & Plan Note (Signed)
Adequately controlled.  Continue amlodipine 10 mg once daily.  Check labs as outlined below.

## 2019-10-03 NOTE — Assessment & Plan Note (Signed)
Continue with exercise.  Discussed decreasing carbohydrates and sweets intake.  Check A1c today.

## 2019-10-03 NOTE — Progress Notes (Signed)
  Anne Rumps, MD Phone: [phone]  Anne Ryan is a 59 y.o. female who presents today for f/u.  HYPERTENSION  Disease Monitoring  Home BP Monitoring not checking Chest pain- no    Dyspnea- no Medications  Compliance-  Taking amlodipine.  Edema- no  Prediabetes: No polyuria or polydipsia.  She does eat a lot of carbohydrates and sweets.  Half a soda per day.  Lots of vegetables.  Does exercise daily with 30 minutes of cardio or strength training.  Social History   Tobacco Use  Smoking Status Former Smoker  Smokeless Tobacco Never Used     ROS see history of present illness  Objective  Physical Exam Vitals:   10/03/19 1551  BP: 120/80  Pulse: 79  Temp: 98.9 F (37.2 C)  SpO2: 97%    BP Readings from Last 3 Encounters:  10/03/19 120/80  05/30/19 130/80  01/24/19 132/83   Wt Readings from Last 3 Encounters:  10/03/19 249 lb 9.6 oz (113.2 kg)  05/30/19 253 lb 12.8 oz (115.1 kg)  01/24/19 253 lb (114.8 kg)    Physical Exam Constitutional:      General: She is not in acute distress.    Appearance: She is not diaphoretic.  Cardiovascular:     Rate and Rhythm: Normal rate and regular rhythm.     Heart sounds: Normal heart sounds.  Pulmonary:     Effort: Pulmonary effort is normal.     Breath sounds: Normal breath sounds.  Musculoskeletal:     Right lower leg: No edema.     Left lower leg: No edema.  Skin:    General: Skin is warm and dry.  Neurological:     Mental Status: She is alert.      Assessment/Plan: Please see individual problem list.  HTN (hypertension) Adequately controlled.  Continue amlodipine 10 mg once daily.  Check labs as outlined below.  Prediabetes Continue with exercise.  Discussed decreasing carbohydrates and sweets intake.  Check A1c today.   Health Maintenance: Patient defers flu vaccine till later in the season.  Orders Placed This Encounter  Procedures  . Comp Met (CMET)  . Lipid panel  . HgB A1c    No orders  of the defined types were placed in this encounter.   [name] was seen today for follow-up.  Diagnoses and all orders for this visit:  Essential hypertension -     Comp Met (CMET) -     Lipid panel  Prediabetes -     HgB A1c     This visit occurred during the SARS-CoV-2 public health emergency.  Safety protocols were in place, including screening questions prior to the visit, additional usage of staff PPE, and extensive cleaning of exam room while observing appropriate contact time as indicated for disinfecting solutions.    Anne Rumps, MD [employer]

## 2019-10-04 ENCOUNTER — Other Ambulatory Visit: Payer: Self-pay | Admitting: Family Medicine

## 2019-10-04 DIAGNOSIS — R7989 Other specified abnormal findings of blood chemistry: Secondary | ICD-10-CM

## 2019-10-04 DIAGNOSIS — R945 Abnormal results of liver function studies: Secondary | ICD-10-CM

## 2019-10-04 LAB — COMPREHENSIVE METABOLIC PANEL
AG Ratio: 1.5 (calc) (ref 1.0–2.5)
ALT: 36 U/L — ABNORMAL HIGH (ref 6–29)
AST: 27 U/L (ref 10–35)
Albumin: 4.4 g/dL (ref 3.6–5.1)
Alkaline phosphatase (APISO): 101 U/L (ref 37–153)
BUN: 20 mg/dL (ref 7–25)
CO2: 27 mmol/L (ref 20–32)
Calcium: 9.7 mg/dL (ref 8.6–10.4)
Chloride: 103 mmol/L (ref 98–110)
Creat: 0.88 mg/dL (ref 0.50–1.05)
Globulin: 2.9 g/dL (calc) (ref 1.9–3.7)
Glucose, Bld: 121 mg/dL — ABNORMAL HIGH (ref 65–99)
Potassium: 3.6 mmol/L (ref 3.5–5.3)
Sodium: 139 mmol/L (ref 135–146)
Total Bilirubin: 0.6 mg/dL (ref 0.2–1.2)
Total Protein: 7.3 g/dL (ref 6.1–8.1)

## 2019-10-04 LAB — HEMOGLOBIN A1C
Hgb A1c MFr Bld: 5.5 % of total Hgb (ref <5.7)
Mean Plasma Glucose: 111 (calc)
eAG (mmol/L): 6.2 (calc)

## 2019-10-04 LAB — LIPID PANEL
Cholesterol: 189 mg/dL (ref <200)
HDL: 45 mg/dL — ABNORMAL LOW (ref >=50)
LDL Cholesterol (Calc): 119 mg/dL (calc) — ABNORMAL HIGH
Non-HDL Cholesterol (Calc): 144 mg/dL (calc) — ABNORMAL HIGH (ref <130)
Total CHOL/HDL Ratio: 4.2 (calc) (ref <5.0)
Triglycerides: 134 mg/dL (ref <150)

## 2019-10-08 ENCOUNTER — Telehealth: Payer: Self-pay | Admitting: Family Medicine

## 2019-10-08 NOTE — Telephone Encounter (Signed)
Gave patient the lab results. See result note.  Anne Ryan,cma

## 2019-10-08 NOTE — Telephone Encounter (Signed)
-----   Message from Glori Luis, MD sent at 10/04/2019  1:33 PM EDT ----- Please of the patient know one of her liver enzymes is minimally elevated. I would like to recheck this in 2 weeks. Orders placed. Her cholesterol is mildly elevated. She needs to work on diet and exercise for this. She does not require medication at this time. Her A1c is in the normal range.  The 10-year ASCVD risk score Denman George DC Montez Hageman., et al., 2013) is: 3.2%   Values used to calculate the score:     Age: 59 years     Sex: Female     Is Non-Hispanic African American: No     Diabetic: No     Tobacco smoker: No     Systolic Blood Pressure: 120 mmHg     Is BP treated: Yes     HDL Cholesterol: 45 mg/dL     Total Cholesterol: 189 mg/dL

## 2019-10-08 NOTE — Telephone Encounter (Signed)
Patient was returning call for lab results 

## 2019-10-24 ENCOUNTER — Other Ambulatory Visit: Payer: Self-pay

## 2019-10-24 ENCOUNTER — Other Ambulatory Visit (INDEPENDENT_AMBULATORY_CARE_PROVIDER_SITE_OTHER): Payer: BC Managed Care – PPO

## 2019-10-24 DIAGNOSIS — R945 Abnormal results of liver function studies: Secondary | ICD-10-CM

## 2019-10-24 DIAGNOSIS — R7989 Other specified abnormal findings of blood chemistry: Secondary | ICD-10-CM

## 2019-10-24 LAB — HEPATIC FUNCTION PANEL
ALT: 26 U/L (ref 0–35)
AST: 25 U/L (ref 0–37)
Albumin: 4.5 g/dL (ref 3.5–5.2)
Alkaline Phosphatase: 91 U/L (ref 39–117)
Bilirubin, Direct: 0.1 mg/dL (ref 0.0–0.3)
Total Bilirubin: 0.7 mg/dL (ref 0.2–1.2)
Total Protein: 7.4 g/dL (ref 6.0–8.3)

## 2019-12-06 ENCOUNTER — Other Ambulatory Visit: Payer: Self-pay | Admitting: Family Medicine

## 2020-02-10 DIAGNOSIS — R7309 Other abnormal glucose: Secondary | ICD-10-CM | POA: Diagnosis not present

## 2020-03-01 ENCOUNTER — Other Ambulatory Visit: Payer: Self-pay | Admitting: Family Medicine

## 2020-03-09 DIAGNOSIS — R7309 Other abnormal glucose: Secondary | ICD-10-CM | POA: Diagnosis not present

## 2020-04-02 ENCOUNTER — Ambulatory Visit: Payer: BC Managed Care – PPO | Admitting: Family Medicine

## 2020-04-09 DIAGNOSIS — R7309 Other abnormal glucose: Secondary | ICD-10-CM | POA: Diagnosis not present

## 2020-04-14 ENCOUNTER — Other Ambulatory Visit: Payer: Self-pay

## 2020-04-16 ENCOUNTER — Encounter: Payer: Self-pay | Admitting: Family Medicine

## 2020-04-16 ENCOUNTER — Ambulatory Visit (INDEPENDENT_AMBULATORY_CARE_PROVIDER_SITE_OTHER): Payer: BC Managed Care – PPO | Admitting: Family Medicine

## 2020-04-16 ENCOUNTER — Other Ambulatory Visit: Payer: Self-pay

## 2020-04-16 DIAGNOSIS — Z6839 Body mass index (BMI) 39.0-39.9, adult: Secondary | ICD-10-CM

## 2020-04-16 DIAGNOSIS — I10 Essential (primary) hypertension: Secondary | ICD-10-CM

## 2020-04-16 DIAGNOSIS — R7303 Prediabetes: Secondary | ICD-10-CM

## 2020-04-16 NOTE — Patient Instructions (Signed)
Nice to see you. 1600 cal is a relatively good calorie goal for you. Please continue to work on diet and exercise. Please continue to monitor your blood pressure.

## 2020-04-16 NOTE — Assessment & Plan Note (Signed)
Encouraged continued diet and exercise.  Discussed that weight loss oftentimes lags dietary and exercise changes.

## 2020-04-16 NOTE — Assessment & Plan Note (Addendum)
Encouraged continued diet and exercise.  She has been sticking to a calorie goal of 1600 cal/day though notes she does not track some things throughout the day such as an ounce of butter.  I encouraged her to keep a good track of what she is taking in.

## 2020-04-16 NOTE — Assessment & Plan Note (Signed)
Slightly above goal.  The patient is not interested in adding an additional medication at this time.  She will continue amlodipine 10 mg once daily.  She will continue to work on diet and exercise.

## 2020-04-16 NOTE — Progress Notes (Signed)
Marikay Alar, MD Phone: [phone]  Anne Ryan is a 60 y.o. female who presents today for f/u.  HYPERTENSION  Disease Monitoring  Home BP Monitoring 118-133/74-85 Chest pain- no    Dyspnea- no Medications  Compliance-  Taking amlodipine.  Edema- no  Prediabetes: No polyuria or polydipsia.  She has been working on diet and exercise.  She lifts weights or walks daily for exercise.  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  She reports she is getting plenty of vegetables.  She has been using a salt substitute.  She has 1 diet soda per day.  She has some sweets such as a Klondike bar once daily though nothing more than that.  She has not noticed any weight loss.  She has been exercising for 2 years.  She is only been working on her diet consistently over the last 2 months.  She does note her clothes are fitting better.    Social History   Tobacco Use  Smoking Status Former Smoker  Smokeless Tobacco Never Used    Current Outpatient Medications on File Prior to Visit  Medication Sig Dispense Refill  . amLODipine (NORVASC) 10 MG tablet TAKE 1 TABLET BY MOUTH EVERY DAY 90 tablet 1  . Cyanocobalamin (VITAMIN B 12 PO) Take by mouth.    . fluticasone (FLONASE) 50 MCG/ACT nasal spray Place 2 sprays into both nostrils daily. 16 g 6  . loratadine (CLARITIN) 10 MG tablet TAKE 1 TABLET BY MOUTH DAILY 90 tablet 1  . Multiple Vitamin (THERA) TABS Take by mouth.    . Omega-3 Fatty Acids (FISH OIL) 1000 MG CAPS Take by mouth.     No current facility-administered medications on file prior to visit.     ROS see history of present illness  Objective  Physical Exam Vitals:   04/16/20 1615  BP: 115/70  Pulse: 70  Temp: 98.1 F (36.7 C)  SpO2: 98%    BP Readings from Last 3 Encounters:  04/16/20 115/70  10/03/19 120/80  05/30/19 130/80   Wt Readings from Last 3 Encounters:  04/16/20 253 lb (114.8 kg)  10/03/19 249 lb 9.6 oz (113.2 kg)  05/30/19 253 lb 12.8 oz (115.1  kg)    Physical Exam Constitutional:      General: She is not in acute distress.    Appearance: She is not diaphoretic.  Cardiovascular:     Rate and Rhythm: Normal rate and regular rhythm.     Heart sounds: Normal heart sounds.  Pulmonary:     Effort: Pulmonary effort is normal.     Breath sounds: Normal breath sounds.  Skin:    General: Skin is warm and dry.  Neurological:     Mental Status: She is alert.      Assessment/Plan: Please see individual problem list.  Problem List Items Addressed This Visit    HTN (hypertension)    Slightly above goal.  The patient is not interested in adding an additional medication at this time.  She will continue amlodipine 10 mg once daily.  She will continue to work on diet and exercise.      Prediabetes    Encouraged continued diet and exercise.  She has been sticking to a calorie goal of 1600 cal/day though notes she does not track some things throughout the day such as an ounce of butter.  I encouraged her to keep a good track of what she is taking in.  This visit occurred during the SARS-CoV-2 public health emergency.  Safety protocols were in place, including screening questions prior to the visit, additional usage of staff PPE, and extensive cleaning of exam room while observing appropriate contact time as indicated for disinfecting solutions.    Jensine Luz, MD [facility] [place]  

## 2020-05-09 DIAGNOSIS — R7309 Other abnormal glucose: Secondary | ICD-10-CM | POA: Diagnosis not present

## 2020-05-29 ENCOUNTER — Other Ambulatory Visit: Payer: Self-pay | Admitting: Family Medicine

## 2020-06-09 DIAGNOSIS — R7309 Other abnormal glucose: Secondary | ICD-10-CM | POA: Diagnosis not present

## 2020-07-06 ENCOUNTER — Ambulatory Visit: Payer: BC Managed Care – PPO | Admitting: Dermatology

## 2020-07-09 DIAGNOSIS — R7309 Other abnormal glucose: Secondary | ICD-10-CM | POA: Diagnosis not present

## 2020-07-19 ENCOUNTER — Other Ambulatory Visit: Payer: Self-pay | Admitting: Family Medicine

## 2020-07-19 DIAGNOSIS — Z1231 Encounter for screening mammogram for malignant neoplasm of breast: Secondary | ICD-10-CM

## 2020-07-21 ENCOUNTER — Ambulatory Visit: Payer: BC Managed Care – PPO | Admitting: Family Medicine

## 2020-07-26 ENCOUNTER — Other Ambulatory Visit: Payer: Self-pay

## 2020-07-26 ENCOUNTER — Ambulatory Visit
Admission: RE | Admit: 2020-07-26 | Discharge: 2020-07-26 | Disposition: A | Discharge status: Home / Self Care | Payer: BC Managed Care – PPO | Source: Ambulatory Visit | Attending: Family Medicine | Admitting: Family Medicine

## 2020-07-26 DIAGNOSIS — Z1231 Encounter for screening mammogram for malignant neoplasm of breast: Secondary | ICD-10-CM | POA: Diagnosis not present

## 2020-08-09 DIAGNOSIS — R7309 Other abnormal glucose: Secondary | ICD-10-CM | POA: Diagnosis not present

## 2020-08-13 ENCOUNTER — Other Ambulatory Visit: Payer: Self-pay

## 2020-08-13 ENCOUNTER — Ambulatory Visit (INDEPENDENT_AMBULATORY_CARE_PROVIDER_SITE_OTHER): Payer: BC Managed Care – PPO | Admitting: Family Medicine

## 2020-08-13 DIAGNOSIS — I10 Essential (primary) hypertension: Secondary | ICD-10-CM

## 2020-08-13 DIAGNOSIS — Z6838 Body mass index (BMI) 38.0-38.9, adult: Secondary | ICD-10-CM | POA: Diagnosis not present

## 2020-08-13 NOTE — Progress Notes (Signed)
Marikay Alar, MD Phone: [phone]  Anne Ryan is a 60 y.o. female who presents today for f/u.  HYPERTENSION Disease Monitoring Home BP Monitoring almost always <130/80 Chest pain- no    Dyspnea- no Medications Compliance-  taking amlodipine.  Edema- no  Obesity: Patient notes she is doing stretches and some cardio 5 to 6 days a week.  She eats lots of vegetables and tries to avoid carbohydrates.  She is down 8 pounds since our last visit.   Social History   Tobacco Use  Smoking Status Former  Smokeless Tobacco Never    Current Outpatient Medications on File Prior to Visit  Medication Sig Dispense Refill   amLODipine (NORVASC) 10 MG tablet TAKE 1 TABLET BY MOUTH EVERY DAY 90 tablet 1   Cyanocobalamin (VITAMIN B 12 PO) Take by mouth.     fluticasone (FLONASE) 50 MCG/ACT nasal spray Place 2 sprays into both nostrils daily. 16 g 6   loratadine (CLARITIN) 10 MG tablet TAKE 1 TABLET BY MOUTH DAILY 90 tablet 1   Multiple Vitamin (THERA) TABS Take by mouth.     Omega-3 Fatty Acids (FISH OIL) 1000 MG CAPS Take by mouth.     No current facility-administered medications on file prior to visit.     ROS see history of present illness  Objective  Physical Exam Vitals:   08/13/20 1039  BP: 115/80  Pulse: 69  Temp: 98.6 F (37 C)  SpO2: 99%    BP Readings from Last 3 Encounters:  08/13/20 115/80  04/16/20 115/70  10/03/19 120/80   Wt Readings from Last 3 Encounters:  08/13/20 245 lb 9.6 oz (111.4 kg)  04/16/20 253 lb (114.8 kg)  10/03/19 249 lb 9.6 oz (113.2 kg)    Physical Exam Constitutional:      General: She is not in acute distress.    Appearance: She is not diaphoretic.  Cardiovascular:     Rate and Rhythm: Normal rate and regular rhythm.     Heart sounds: Normal heart sounds.  Pulmonary:     Effort: Pulmonary effort is normal.     Breath sounds: Normal breath sounds.  Musculoskeletal:     Right lower leg: No edema.     Left lower leg: No edema.   Skin:    General: Skin is warm and dry.  Neurological:     Mental Status: She is alert.     Assessment/Plan: Please see individual problem list.  Problem List Items Addressed This Visit     HTN (hypertension)    Adequate control.  She will continue amlodipine 10 mg once daily.  If it starts to trend up at home she will let us know.       Obesity    I encouraged continued healthy diet and adequate exercise.  We did discuss pharmacologic interventions for weight loss including Saxenda and wegovy.  She notes no personal or family history of thyroid cancer, parathyroid cancer, or adrenal gland cancer.  She notes no history of gallbladder issues or pancreatitis.  I discussed that those medicines would be options for her if she would like to consider them moving forward though I did advise that diet and exercise is generally the best way for long-term success with weight loss.  She will check with her insurance and discuss with her daughter who is a Teacher, early years/pre and let us know if she would like to try medication in the future.        Return in about 4 months (around  12/13/2020) for Hypertension, weight follow-up.  This visit occurred during the SARS-CoV-2 public health emergency.  Safety protocols were in place, including screening questions prior to the visit, additional usage of staff PPE, and extensive cleaning of exam room while observing appropriate contact time as indicated for disinfecting solutions.    Marikay Alar, MD [facility]

## 2020-08-13 NOTE — Assessment & Plan Note (Signed)
I encouraged continued healthy diet and adequate exercise.  We did discuss pharmacologic interventions for weight loss including Saxenda and wegovy.  She notes no personal or family history of thyroid cancer, parathyroid cancer, or adrenal gland cancer.  She notes no history of gallbladder issues or pancreatitis.  I discussed that those medicines would be options for her if she would like to consider them moving forward though I did advise that diet and exercise is generally the best way for long-term success with weight loss.  She will check with her insurance and discuss with her daughter who is a Teacher, early years/pre and let us know if she would like to try medication in the future.

## 2020-08-13 NOTE — Assessment & Plan Note (Addendum)
Adequate control.  She will continue amlodipine 10 mg once daily.  If it starts to trend up at home she will let us know.

## 2020-08-13 NOTE — Patient Instructions (Signed)
Nice to see you. Please continue with the amlodipine. Please continue with diet exercise. If you would like to consider Saxenda or Ozempic for weight loss please let me know.

## 2020-08-21 ENCOUNTER — Other Ambulatory Visit: Payer: Self-pay | Admitting: Family Medicine

## 2020-09-08 ENCOUNTER — Encounter (INDEPENDENT_AMBULATORY_CARE_PROVIDER_SITE_OTHER): Payer: Self-pay

## 2020-09-09 DIAGNOSIS — R7309 Other abnormal glucose: Secondary | ICD-10-CM | POA: Diagnosis not present

## 2020-10-09 DIAGNOSIS — R7309 Other abnormal glucose: Secondary | ICD-10-CM | POA: Diagnosis not present

## 2020-11-09 DIAGNOSIS — R7309 Other abnormal glucose: Secondary | ICD-10-CM | POA: Diagnosis not present

## 2020-12-09 DIAGNOSIS — I10 Essential (primary) hypertension: Secondary | ICD-10-CM | POA: Diagnosis not present

## 2020-12-10 ENCOUNTER — Other Ambulatory Visit: Payer: Self-pay | Admitting: Family Medicine

## 2020-12-21 ENCOUNTER — Other Ambulatory Visit: Payer: Self-pay

## 2020-12-21 ENCOUNTER — Ambulatory Visit (INDEPENDENT_AMBULATORY_CARE_PROVIDER_SITE_OTHER): Payer: BC Managed Care – PPO | Admitting: Family Medicine

## 2020-12-21 ENCOUNTER — Ambulatory Visit (INDEPENDENT_AMBULATORY_CARE_PROVIDER_SITE_OTHER): Payer: BC Managed Care – PPO

## 2020-12-21 ENCOUNTER — Encounter: Payer: Self-pay | Admitting: Family Medicine

## 2020-12-21 DIAGNOSIS — M545 Low back pain, unspecified: Secondary | ICD-10-CM

## 2020-12-21 DIAGNOSIS — G8929 Other chronic pain: Secondary | ICD-10-CM

## 2020-12-21 DIAGNOSIS — I10 Essential (primary) hypertension: Secondary | ICD-10-CM | POA: Diagnosis not present

## 2020-12-21 DIAGNOSIS — R7303 Prediabetes: Secondary | ICD-10-CM

## 2020-12-21 DIAGNOSIS — Z6839 Body mass index (BMI) 39.0-39.9, adult: Secondary | ICD-10-CM

## 2020-12-21 DIAGNOSIS — M549 Dorsalgia, unspecified: Secondary | ICD-10-CM | POA: Insufficient documentation

## 2020-12-21 LAB — COMPREHENSIVE METABOLIC PANEL
ALT: 26 U/L (ref 0–35)
AST: 23 U/L (ref 0–37)
Albumin: 4.3 g/dL (ref 3.5–5.2)
Alkaline Phosphatase: 81 U/L (ref 39–117)
BUN: 19 mg/dL (ref 6–23)
CO2: 29 mEq/L (ref 19–32)
Calcium: 9.4 mg/dL (ref 8.4–10.5)
Chloride: 103 mEq/L (ref 96–112)
Creatinine, Ser: 0.86 mg/dL (ref 0.40–1.20)
GFR: 74.19 mL/min (ref >60.00)
Glucose, Bld: 90 mg/dL (ref 70–99)
Potassium: 4 mEq/L (ref 3.5–5.1)
Sodium: 138 mEq/L (ref 135–145)
Total Bilirubin: 1 mg/dL (ref 0.2–1.2)
Total Protein: 7.1 g/dL (ref 6.0–8.3)

## 2020-12-21 LAB — LIPID PANEL
Cholesterol: 182 mg/dL (ref 0–200)
HDL: 46.1 mg/dL (ref >39.00)
LDL Cholesterol: 109 mg/dL — ABNORMAL HIGH (ref 0–99)
NonHDL: 136.14
Total CHOL/HDL Ratio: 4
Triglycerides: 136 mg/dL (ref 0.0–149.0)
VLDL: 27.2 mg/dL (ref 0.0–40.0)

## 2020-12-21 LAB — HEMOGLOBIN A1C: Hgb A1c MFr Bld: 5.6 % (ref 4.6–6.5)

## 2020-12-21 NOTE — Assessment & Plan Note (Signed)
Encouraged continued healthy diet.  She is willing to try a medication to help with weight loss.  We will see if we can get Saxenda approved.  She has previously denied personal and family history of thyroid cancer, parathyroid cancer, and adrenal gland cancer.  She reports a possible history of gallstones though I did not find any imaging to indicate this.  No history of pancreatitis.  Discussed risk of medullary thyroid cancer seen in rats studies.  Discussed risks of gallbladder issues as well as pancreatitis with this kind of medication.  She will let us know immediately if she develops abdominal pain or thyroid changes.

## 2020-12-21 NOTE — Patient Instructions (Addendum)
Nice to see you. We will get an x-ray and lab work today.  Once we see what the labs reveal we will send Saxenda in. Once we start the Saxenda if you develop abdominal pain or changes in your anterior neck you need to discontinue the medication and let us know immediately.

## 2020-12-21 NOTE — Progress Notes (Signed)
Tommi Rumps, MD Phone: [phone]  Anne Ryan is a 60 y.o. female who presents today for f/u.  HYPERTENSION Disease Monitoring Home BP Monitoring not checking consistently though typically around 120-80 Chest pain- no    Dyspnea- no Medications Compliance-  taking amlodipine  Edema- no BMET    Component Value Date/Time   NA 139 10/03/2019 1603   K 3.6 10/03/2019 1603   CL 103 10/03/2019 1603   CO2 27 10/03/2019 1603   GLUCOSE 121 (H) 10/03/2019 1603   BUN 20 10/03/2019 1603   CREATININE 0.88 10/03/2019 1603   CALCIUM 9.7 10/03/2019 1603   Obesity: Patient's been sticking with a balanced diet.  Her exercise has been limited related to joint pain and back spasms.  Joint pain/back pain: This has been an ongoing issue.  The back pain has been worse over the last 6 months with spasms after exercising.  She notes her fingers, knees, and hips hurt and are achy.  She limits ibuprofen and Aleve use.  She does see a Restaurant manager, fast food.  She has discomfort in her buttocks and uses a roller to help get this out.  No radiation down the legs.  Does report her left leg feels tight at times though no visible swelling.  Social History   Tobacco Use  Smoking Status Former  Smokeless Tobacco Never    Current Outpatient Medications on File Prior to Visit  Medication Sig Dispense Refill   amLODipine (NORVASC) 10 MG tablet TAKE 1 TABLET BY MOUTH EVERY DAY 90 tablet 1   Cyanocobalamin (VITAMIN B 12 PO) Take by mouth.     fluticasone (FLONASE) 50 MCG/ACT nasal spray Place 2 sprays into both nostrils daily. 16 g 6   loratadine (CLARITIN) 10 MG tablet TAKE 1 TABLET BY MOUTH EVERY DAY 90 tablet 1   Multiple Vitamin (THERA) TABS Take by mouth.     Omega-3 Fatty Acids (FISH OIL) 1000 MG CAPS Take by mouth.     No current facility-administered medications on file prior to visit.     ROS see history of present illness  Objective  Physical Exam Vitals:   12/21/20 0858  BP: 120/81  Pulse: 73   Temp: 98.7 F (37.1 C)  SpO2: 98%    BP Readings from Last 3 Encounters:  12/21/20 120/81  08/13/20 115/80  04/16/20 115/70   Wt Readings from Last 3 Encounters:  12/21/20 252 lb (114.3 kg)  08/13/20 245 lb 9.6 oz (111.4 kg)  04/16/20 253 lb (114.8 kg)    Physical Exam Constitutional:      General: She is not in acute distress.    Appearance: She is not diaphoretic.  Cardiovascular:     Rate and Rhythm: Normal rate and regular rhythm.     Heart sounds: Normal heart sounds.  Pulmonary:     Effort: Pulmonary effort is normal.     Breath sounds: Normal breath sounds.  Musculoskeletal:     Comments: No midline spine tenderness, no midline spine step-off, there is left lumbar muscular back tenderness  Skin:    General: Skin is warm and dry.  Neurological:     Mental Status: She is alert.     Comments: 5/5 strength bilateral quads, hamstrings, plantar flexion, and dorsiflexion, sensation to light touch intact bilateral lower extremities     Assessment/Plan: Please see individual problem list.  Problem List Items Addressed This Visit     Chronic back pain    This is a chronic ongoing issue.  She has been  having more spasms over the last 6 months.  Given the duration of discomfort we will get an x-ray today.  Discussed the potential for physical therapy though she notes she has done that before and knows the exercises.  She will start those at home.      Relevant Orders   DG Lumbar Spine Complete   HTN (hypertension)    Adequately controlled.  She will continue amlodipine 10 mg once daily.  Lab work today.      Relevant Orders   Comp Met (CMET)   Lipid panel   Obesity    Encouraged continued healthy diet.  She is willing to try a medication to help with weight loss.  We will see if we can get Saxenda approved.  She has previously denied personal and family history of thyroid cancer, parathyroid cancer, and adrenal gland cancer.  She reports a possible history of  gallstones though I did not find any imaging to indicate this.  No history of pancreatitis.  Discussed risk of medullary thyroid cancer seen in rats studies.  Discussed risks of gallbladder issues as well as pancreatitis with this kind of medication.  She will let us know immediately if she develops abdominal pain or thyroid changes.      Prediabetes    Check A1c.      Relevant Orders   HgB A1c     Return in about 3 months (around 03/21/2021) for Obesity/hypertension.  This visit occurred during the SARS-CoV-2 public health emergency.  Safety protocols were in place, including screening questions prior to the visit, additional usage of staff PPE, and extensive cleaning of exam room while observing appropriate contact time as indicated for disinfecting solutions.    Tommi Rumps, MD [place]

## 2020-12-21 NOTE — Assessment & Plan Note (Signed)
Adequately controlled.  She will continue amlodipine 10 mg once daily.  Lab work today.

## 2020-12-21 NOTE — Assessment & Plan Note (Signed)
Check A1c. 

## 2020-12-21 NOTE — Assessment & Plan Note (Signed)
This is a chronic ongoing issue.  She has been having more spasms over the last 6 months.  Given the duration of discomfort we will get an x-ray today.  Discussed the potential for physical therapy though she notes she has done that before and knows the exercises.  She will start those at home.

## 2020-12-22 ENCOUNTER — Other Ambulatory Visit: Payer: Self-pay | Admitting: Family Medicine

## 2020-12-22 MED ORDER — SAXENDA 18 MG/3ML ~~LOC~~ SOPN: PEN_INJECTOR | SUBCUTANEOUS | 0 refills | Status: DC

## 2021-01-09 DIAGNOSIS — I10 Essential (primary) hypertension: Secondary | ICD-10-CM | POA: Diagnosis not present

## 2021-01-11 ENCOUNTER — Encounter: Payer: Self-pay | Admitting: Family Medicine

## 2021-01-17 ENCOUNTER — Encounter: Payer: Self-pay | Admitting: Family Medicine

## 2021-01-19 NOTE — Telephone Encounter (Signed)
JUst FYI patient declined Saxenda.

## 2021-02-18 ENCOUNTER — Other Ambulatory Visit: Payer: Self-pay | Admitting: Family Medicine

## 2021-03-23 ENCOUNTER — Ambulatory Visit (INDEPENDENT_AMBULATORY_CARE_PROVIDER_SITE_OTHER): Payer: BC Managed Care – PPO | Admitting: Family Medicine

## 2021-03-23 ENCOUNTER — Encounter: Payer: Self-pay | Admitting: Family Medicine

## 2021-03-23 ENCOUNTER — Other Ambulatory Visit: Payer: Self-pay

## 2021-03-23 DIAGNOSIS — M545 Low back pain, unspecified: Secondary | ICD-10-CM | POA: Diagnosis not present

## 2021-03-23 DIAGNOSIS — G8929 Other chronic pain: Secondary | ICD-10-CM | POA: Diagnosis not present

## 2021-03-23 DIAGNOSIS — I10 Essential (primary) hypertension: Secondary | ICD-10-CM | POA: Diagnosis not present

## 2021-03-23 DIAGNOSIS — Z6839 Body mass index (BMI) 39.0-39.9, adult: Secondary | ICD-10-CM

## 2021-03-23 NOTE — Assessment & Plan Note (Signed)
This continues to be an issue.  We will refer to a spine specialist. ?

## 2021-03-23 NOTE — Patient Instructions (Signed)
Nice to see you. ?We will refer you to the weight management clinic and to the spine specialist.  If you do not hear from them in the next couple of weeks please let us know. ?

## 2021-03-23 NOTE — Assessment & Plan Note (Signed)
Adequately controlled.  She will continue amlodipine 10 mg once daily. ?

## 2021-03-23 NOTE — Assessment & Plan Note (Signed)
Encouraged continued dietary changes and exercise.  We will refer to the weight management clinic in [place]. ?

## 2021-03-23 NOTE — Progress Notes (Signed)
?Marikay Alar, MD ?Phone: [phone] ? ?Anne Ryan is a 61 y.o. female who presents today for f/u. ? ?HYPERTENSION ?Disease Monitoring ?Home BP Monitoring 120s/70s-low 80s Chest pain- no    Dyspnea- no ?Medications ?Compliance-  taking amlodipine.  Edema- no ?BMET ?   ?Component Value Date/Time  ? NA 138 12/21/2020 0933  ? K 4.0 12/21/2020 0933  ? CL 103 12/21/2020 0933  ? CO2 29 12/21/2020 0933  ? GLUCOSE 90 12/21/2020 0933  ? BUN 19 12/21/2020 0933  ? CREATININE 0.86 12/21/2020 0933  ? CREATININE 0.88 10/03/2019 1603  ? CALCIUM 9.4 12/21/2020 0933  ? ?Left low back pain: This continues to be an issue.  It has gotten a little more constant and slightly worse.  No radiation.  No numbness, weakness, or incontinence.  She is ready to see a specialist for this. ? ?Obesity: Patient continues with healthy diet with fruits and vegetables.  She is eating mostly whole grains.  Mostly lean meats.  She works out 3-4 times a week and also walks.  She notes the Saxenda was excessively expensive. ? ?Social History  ? ?Tobacco Use  ?Smoking Status Former  ?Smokeless Tobacco Never  ? ? ?Current Outpatient Medications on File Prior to Visit  ?Medication Sig Dispense Refill  ? amLODipine (NORVASC) 10 MG tablet TAKE 1 TABLET BY MOUTH EVERY DAY 90 tablet 1  ? Cyanocobalamin (VITAMIN B 12 PO) Take by mouth.    ? fluticasone (FLONASE) 50 MCG/ACT nasal spray Place 2 sprays into both nostrils daily. 16 g 6  ? loratadine (CLARITIN) 10 MG tablet TAKE 1 TABLET BY MOUTH EVERY DAY 90 tablet 1  ? Multiple Vitamin (THERA) TABS Take by mouth.    ? Omega-3 Fatty Acids (FISH OIL) 1000 MG CAPS Take by mouth.    ? ?No current facility-administered medications on file prior to visit.  ? ? ? ?ROS see history of present illness ? ?Objective ? ?Physical Exam ?Vitals:  ? 03/23/21 0910  ?BP: 120/78  ?Pulse: 66  ?Temp: 98.6 ?F (37 ?C)  ?SpO2: 98%  ? ? ?BP Readings from Last 3 Encounters:  ?03/23/21 120/78  ?12/21/20 120/81  ?08/13/20 115/80  ? ?Wt  Readings from Last 3 Encounters:  ?03/23/21 258 lb 6.4 oz (117.2 kg)  ?12/21/20 252 lb (114.3 kg)  ?08/13/20 245 lb 9.6 oz (111.4 kg)  ? ? ?Physical Exam ?Constitutional:   ?   General: She is not in acute distress. ?   Appearance: She is not diaphoretic.  ?Cardiovascular:  ?   Rate and Rhythm: Normal rate and regular rhythm.  ?   Heart sounds: Normal heart sounds.  ?Pulmonary:  ?   Effort: Pulmonary effort is normal.  ?   Breath sounds: Normal breath sounds.  ?Musculoskeletal:  ?   Comments: No midline spine tenderness, no midline spine step-off, no muscular back tenderness  ?Skin: ?   General: Skin is warm and dry.  ?Neurological:  ?   Mental Status: She is alert.  ?   Comments: 5/5 strength bilateral quads, hamstrings, plantarflexion, and dorsiflexion, sensation light touch intact bilateral lower extremities  ? ? ? ?Assessment/Plan: Please see individual problem list. ? ?Problem List Items Addressed This Visit   ? ? Chronic back pain  ?  This continues to be an issue.  We will refer to a spine specialist. ?  ?  ? Relevant Orders  ? Ambulatory referral to Pain Clinic  ? HTN (hypertension)  ?  Adequately controlled.  She will  continue amlodipine 10 mg once daily. ?  ?  ? Obesity  ?  Encouraged continued dietary changes and exercise.  We will refer to the weight management clinic in [place]. ?  ?  ? Relevant Orders  ? Amb Ref to Medical Weight Management  ? ? ? ?Return in about 6 months (around 09/23/2021) for Weight follow-up/hypertension. ? ?This visit occurred during the SARS-CoV-2 public health emergency.  Safety protocols were in place, including screening questions prior to the visit, additional usage of staff PPE, and extensive cleaning of exam room while observing appropriate contact time as indicated for disinfecting solutions.  ? ? ?Marikay Alar, MD ?[facility] [place] ? ?

## 2021-06-07 DIAGNOSIS — Z0289 Encounter for other administrative examinations: Secondary | ICD-10-CM

## 2021-06-18 ENCOUNTER — Encounter: Payer: Self-pay | Admitting: Family Medicine

## 2021-06-20 ENCOUNTER — Other Ambulatory Visit: Payer: Self-pay | Admitting: Family Medicine

## 2021-07-04 ENCOUNTER — Telehealth: Payer: Self-pay | Admitting: Family Medicine

## 2021-07-04 DIAGNOSIS — G8929 Other chronic pain: Secondary | ICD-10-CM

## 2021-07-05 ENCOUNTER — Other Ambulatory Visit: Payer: Self-pay | Admitting: Family Medicine

## 2021-07-05 DIAGNOSIS — Z1231 Encounter for screening mammogram for malignant neoplasm of breast: Secondary | ICD-10-CM

## 2021-07-22 DIAGNOSIS — M47816 Spondylosis without myelopathy or radiculopathy, lumbar region: Secondary | ICD-10-CM | POA: Diagnosis not present

## 2021-07-22 DIAGNOSIS — M545 Low back pain, unspecified: Secondary | ICD-10-CM | POA: Diagnosis not present

## 2021-07-28 ENCOUNTER — Ambulatory Visit (INDEPENDENT_AMBULATORY_CARE_PROVIDER_SITE_OTHER): Payer: BC Managed Care – PPO | Admitting: Bariatrics

## 2021-07-28 ENCOUNTER — Encounter (INDEPENDENT_AMBULATORY_CARE_PROVIDER_SITE_OTHER): Payer: Self-pay | Admitting: Bariatrics

## 2021-07-28 VITALS — BP 127/78 | HR 51 | Temp 97.8°F | Ht 67.0 in | Wt 249.0 lb

## 2021-07-28 DIAGNOSIS — F5089 Other specified eating disorder: Secondary | ICD-10-CM | POA: Diagnosis not present

## 2021-07-28 DIAGNOSIS — I10 Essential (primary) hypertension: Secondary | ICD-10-CM | POA: Diagnosis not present

## 2021-07-28 DIAGNOSIS — R5383 Other fatigue: Secondary | ICD-10-CM | POA: Diagnosis not present

## 2021-07-28 DIAGNOSIS — R0602 Shortness of breath: Secondary | ICD-10-CM | POA: Diagnosis not present

## 2021-07-28 DIAGNOSIS — Z0000 Encounter for general adult medical examination without abnormal findings: Secondary | ICD-10-CM | POA: Insufficient documentation

## 2021-07-28 DIAGNOSIS — R7303 Prediabetes: Secondary | ICD-10-CM

## 2021-07-28 DIAGNOSIS — E559 Vitamin D deficiency, unspecified: Secondary | ICD-10-CM | POA: Diagnosis not present

## 2021-07-28 DIAGNOSIS — Z1331 Encounter for screening for depression: Secondary | ICD-10-CM | POA: Diagnosis not present

## 2021-07-28 DIAGNOSIS — Z6839 Body mass index (BMI) 39.0-39.9, adult: Secondary | ICD-10-CM

## 2021-07-28 DIAGNOSIS — F509 Eating disorder, unspecified: Secondary | ICD-10-CM | POA: Insufficient documentation

## 2021-07-29 LAB — COMPREHENSIVE METABOLIC PANEL
ALT: 39 IU/L — ABNORMAL HIGH (ref 0–32)
AST: 22 IU/L (ref 0–40)
Albumin/Globulin Ratio: 1.7 (ref 1.2–2.2)
Albumin: 4.8 g/dL (ref 3.8–4.9)
Alkaline Phosphatase: 88 IU/L (ref 44–121)
BUN/Creatinine Ratio: 29 — ABNORMAL HIGH (ref 9–23)
BUN: 25 mg/dL — ABNORMAL HIGH (ref 6–24)
Bilirubin Total: 0.6 mg/dL (ref 0.0–1.2)
CO2: 25 mmol/L (ref 20–29)
Calcium: 10.1 mg/dL (ref 8.7–10.2)
Chloride: 99 mmol/L (ref 96–106)
Creatinine, Ser: 0.87 mg/dL (ref 0.57–1.00)
Globulin, Total: 2.9 g/dL (ref 1.5–4.5)
Glucose: 72 mg/dL (ref 70–99)
Potassium: 4 mmol/L (ref 3.5–5.2)
Sodium: 141 mmol/L (ref 134–144)
Total Protein: 7.7 g/dL (ref 6.0–8.5)
eGFR: 77 mL/min/{1.73_m2} (ref >59)

## 2021-07-29 LAB — VITAMIN D 25 HYDROXY (VIT D DEFICIENCY, FRACTURES): Vit D, 25-Hydroxy: 25.2 ng/mL — ABNORMAL LOW (ref 30.0–100.0)

## 2021-07-29 LAB — LIPID PANEL WITH LDL/HDL RATIO
Cholesterol, Total: 201 mg/dL — ABNORMAL HIGH (ref 100–199)
HDL: 57 mg/dL (ref >39)
LDL Chol Calc (NIH): 124 mg/dL — ABNORMAL HIGH (ref 0–99)
LDL/HDL Ratio: 2.2 ratio (ref 0.0–3.2)
Triglycerides: 111 mg/dL (ref 0–149)
VLDL Cholesterol Cal: 20 mg/dL (ref 5–40)

## 2021-07-29 LAB — TSH+T4F+T3FREE
Free T4: 1.34 ng/dL (ref 0.82–1.77)
T3, Free: 3.2 pg/mL (ref 2.0–4.4)
TSH: 7.53 u[IU]/mL — ABNORMAL HIGH (ref 0.450–4.500)

## 2021-07-29 LAB — INSULIN, RANDOM: INSULIN: 10.7 u[IU]/mL (ref 2.6–24.9)

## 2021-07-29 LAB — HEMOGLOBIN A1C
Est. average glucose Bld gHb Est-mCnc: 111 mg/dL
Hgb A1c MFr Bld: 5.5 % (ref 4.8–5.6)

## 2021-08-01 ENCOUNTER — Encounter (INDEPENDENT_AMBULATORY_CARE_PROVIDER_SITE_OTHER): Payer: Self-pay | Admitting: Bariatrics

## 2021-08-01 DIAGNOSIS — E7800 Pure hypercholesterolemia, unspecified: Secondary | ICD-10-CM | POA: Insufficient documentation

## 2021-08-01 DIAGNOSIS — R7989 Other specified abnormal findings of blood chemistry: Secondary | ICD-10-CM | POA: Insufficient documentation

## 2021-08-03 NOTE — Progress Notes (Signed)
Chief Complaint:   OBESITY Anne Ryan (MR# [id number]) is a 61 y.o. female who presents for evaluation and treatment of obesity and related comorbidities. Current BMI is Body mass index is 39 kg/m. Anne Ryan has been struggling with her weight for many years and has been unsuccessful in either losing weight, maintaining weight loss, or reaching her healthy weight goal.  Anne Ryan sometimes likes to cook, but she notes obstacles as finding a variety of healthy foods.  She craves carbohydrates.  Anne Ryan is currently in the action stage of change and ready to dedicate time achieving and maintaining a healthier weight. Anne Ryan is interested in becoming our patient and working on intensive lifestyle modifications including (but not limited to) diet and exercise for weight loss.  Anne Ryan's habits were reviewed today and are as follows: Her family eats meals together, she thinks her family will eat healthier with her, her desired weight loss is 79 lbs, she has been heavy most of her life, she started gaining weight in the last 10 years, her heaviest weight ever was 250 pounds, she has significant food cravings issues, she skips meals frequently, she is frequently drinking liquids with calories, she frequently makes poor food choices, she has problems with excessive hunger, she frequently eats larger portions than normal, and she struggles with emotional eating.  Depression Screen Anne Ryan's Food and Mood (modified PHQ-9) Ryan was 23.     07/28/2021    7:03 AM  Depression screen PHQ 2/9  Decreased Interest 2  Down, Depressed, Hopeless 3  PHQ - 2 Ryan 5  Altered sleeping 3  Tired, decreased energy 3  Change in appetite 3  Feeling bad or failure about yourself  3  Trouble concentrating 3  Moving slowly or fidgety/restless 2  Suicidal thoughts 1  PHQ-9 Ryan 23  Difficult doing work/chores Somewhat difficult   Subjective:   1. Other fatigue Anne Ryan admits to daytime somnolence and admits to waking up  still tired. Patient has a history of symptoms of daytime fatigue and morning fatigue. Anne Ryan generally gets 7 hours of sleep per night, and states that she has nightime awakenings. Anne Ryan is present. Apneic episodes are not present. Anne Ryan is 7.   2. SOB (shortness of breath) on exertion Anne Ryan notes increasing shortness of breath with exercising and seems to be worsening over time with weight gain. She notes getting out of breath sooner with activity than she used to. This has not gotten worse recently. Anne Ryan denies shortness of breath at rest or orthopnea.  3. Prediabetes Anne Ryan is not on medications currently.  4. Primary hypertension Anne Ryan is taking amlodipine and her blood pressure is controlled.  5. Vitamin D deficiency Anne Ryan is taking multivitamins currently.  6. Health care maintenance Obesity, and she is due for labs.  7. Other disorder of eating Anne Ryan notes emotional eating.  She denies suicidal ideations.  Assessment/Plan:   1. Other fatigue Anne Ryan does feel that her weight is causing her energy to be lower than it should be. Fatigue may be related to obesity, depression or many other causes. Labs will be ordered, and in the meanwhile, Anne Ryan will focus on self care including making healthy food choices, increasing physical activity and focusing on stress reduction.  - EKG 12-Lead - TSH+T4F+T3Free  2. SOB (shortness of breath) on exertion Anne Ryan does feel that she gets out of breath more easily that she used to when she exercises. Anne Ryan's shortness of breath appears to be obesity related and exercise induced. She  has agreed to work on weight loss and gradually increase exercise to treat her exercise induced shortness of breath. Will continue to monitor closely.  - TSH+T4F+T3Free  3. Prediabetes We will check labs today.  Anne Ryan will begin her meal plan and will keep carbohydrates low.  - Comprehensive metabolic panel - Insulin, random - Hemoglobin  A1c  4. Primary hypertension We will check labs today, and Anne Ryan will continue her medications as directed.  - Comprehensive metabolic panel  5. Vitamin D deficiency We will check labs today, and we will follow-up at [facility] next office visit.  - VITAMIN D 25 Hydroxy (Vit-D Deficiency, Fractures)  6. Health care maintenance We will check labs today, and we will [facility]up at [facility] office visit.  - Lipid Panel With LDL/HDL Ratio  7. Other disorder of eating Anne Ryan will consider evaluation with [name], our bariatric psychologist if needed.  8. Depression screening Anne Ryan had a positive depression screening. Depression is commonly associated with obesity and often results in emotional eating behaviors. We will monitor this closely and work on CBT to help improve the non-hunger eating patterns. Referral to Psychology may be required if no improvement is seen as she continues in our clinic.  9. Class 2 severe obesity with serious comorbidity and body mass index (BMI) of 39.0 to 39.9 in adult, unspecified obesity type (HCC) Anne Ryan is currently in the action stage of change and her goal is to continue with weight loss efforts. I recommend Anne Ryan begin the structured treatment plan as follows:  She has agreed to the Category 2 Plan and keeping a food journal and adhering to recommended goals of 1200 calories and 80 grams of protein.  Meal planning and intentional eating were discussed.  She is to minimize meal skipping.  Reviewed labs with the patient from 12/21/2020, CMP, lipids, glucose, and A1c.  Exercise goals: No exercise has been prescribed at this time.   Behavioral modification strategies: increasing lean protein intake, decreasing simple carbohydrates, increasing vegetables, increasing water intake, decreasing liquid calories, decreasing eating out, no skipping meals, meal planning and cooking strategies, keeping healthy foods in the home, and planning for success.  She was  informed of the importance of frequent follow-up visits to maximize her success with intensive lifestyle modifications for her multiple health conditions. She was informed we would discuss her lab results at her next visit unless there is a critical issue that needs to be addressed sooner. Celestial agreed to keep her next visit at the agreed upon time to discuss these results.  Objective:   Blood pressure 127/78, pulse (!) 51, temperature 97.8 F (36.6 C), height 5\' 7"  (1.702 m), weight 249 lb (112.9 kg), SpO2 97 %. Body mass index is 39 kg/m.  EKG: Normal sinus rhythm, rate 50 BPM.  Indirect Calorimeter completed today shows a VO2 of 263 and a REE of 1814.  Her calculated basal metabolic rate is thus her basal metabolic rate is worse than expected.  General: Cooperative, alert, well developed, in no acute distress. HEENT: Conjunctivae and lids unremarkable. Cardiovascular: Regular rhythm.  Lungs: Normal work of breathing. Neurologic: No focal deficits.   Lab Results  Component Value Date   CREATININE 0.87 07/28/2021   BUN 25 (H) 07/28/2021   NA 141 07/28/2021   K 4.0 07/28/2021   CL 99 07/28/2021   CO2 25 07/28/2021   Lab Results  Component Value Date   ALT 39 (H) 07/28/2021   AST 22 07/28/2021   ALKPHOS 88 07/28/2021  BILITOT 0.6 07/28/2021   Lab Results  Component Value Date   HGBA1C 5.5 07/28/2021   HGBA1C 5.6 12/21/2020   HGBA1C 5.5 10/03/2019   HGBA1C 5.8 09/05/2018   HGBA1C 5.5 04/06/2017   Lab Results  Component Value Date   INSULIN 10.7 07/28/2021   Lab Results  Component Value Date   TSH 7.530 (H) 07/28/2021   Lab Results  Component Value Date   CHOL 201 (H) 07/28/2021   HDL 57 07/28/2021   LDLCALC 124 (H) 07/28/2021   TRIG 111 07/28/2021   CHOLHDL 4 12/21/2020   Lab Results  Component Value Date   WBC 5.2 03/28/2016   HGB 13.8 03/28/2016   HCT 40.3 03/28/2016   MCV 85.9 03/28/2016   PLT 280.0 03/28/2016   No results found for: "IRON",  "TIBC", "FERRITIN"  Attestation Statements:   Reviewed by clinician on day of visit: allergies, medications, problem list, medical history, surgical history, family history, social history, and previous encounter notes.  Trude Mcburney, am acting as Energy manager for [employer], DO.  I have reviewed the above documentation for accuracy and completeness, and I agree with the above. Corinna Capra, DO

## 2021-08-04 ENCOUNTER — Ambulatory Visit
Admission: RE | Admit: 2021-08-04 | Discharge: 2021-08-04 | Disposition: A | Discharge status: Home / Self Care | Payer: BC Managed Care – PPO | Source: Ambulatory Visit | Attending: Family Medicine | Admitting: Family Medicine

## 2021-08-04 DIAGNOSIS — Z1231 Encounter for screening mammogram for malignant neoplasm of breast: Secondary | ICD-10-CM | POA: Diagnosis not present

## 2021-08-09 DIAGNOSIS — G8929 Other chronic pain: Secondary | ICD-10-CM | POA: Diagnosis not present

## 2021-08-09 DIAGNOSIS — M545 Low back pain, unspecified: Secondary | ICD-10-CM | POA: Diagnosis not present

## 2021-08-10 ENCOUNTER — Encounter (INDEPENDENT_AMBULATORY_CARE_PROVIDER_SITE_OTHER): Payer: Self-pay | Admitting: Bariatrics

## 2021-08-10 ENCOUNTER — Ambulatory Visit (INDEPENDENT_AMBULATORY_CARE_PROVIDER_SITE_OTHER): Payer: BC Managed Care – PPO | Admitting: Bariatrics

## 2021-08-10 VITALS — BP 115/77 | HR 64 | Temp 98.0°F | Ht 67.0 in | Wt 249.0 lb

## 2021-08-10 DIAGNOSIS — Z6839 Body mass index (BMI) 39.0-39.9, adult: Secondary | ICD-10-CM

## 2021-08-10 DIAGNOSIS — E7800 Pure hypercholesterolemia, unspecified: Secondary | ICD-10-CM | POA: Diagnosis not present

## 2021-08-10 DIAGNOSIS — E669 Obesity, unspecified: Secondary | ICD-10-CM

## 2021-08-10 DIAGNOSIS — R946 Abnormal results of thyroid function studies: Secondary | ICD-10-CM

## 2021-08-10 DIAGNOSIS — R7989 Other specified abnormal findings of blood chemistry: Secondary | ICD-10-CM

## 2021-08-10 DIAGNOSIS — E559 Vitamin D deficiency, unspecified: Secondary | ICD-10-CM

## 2021-08-10 MED ORDER — VITAMIN D (ERGOCALCIFEROL) 1.25 MG (50000 UNIT) PO CAPS: 50000.0000 [IU] | ORAL_CAPSULE | ORAL | 0 refills | Status: DC

## 2021-08-14 ENCOUNTER — Other Ambulatory Visit: Payer: Self-pay | Admitting: Family Medicine

## 2021-08-17 ENCOUNTER — Encounter (INDEPENDENT_AMBULATORY_CARE_PROVIDER_SITE_OTHER): Payer: Self-pay

## 2021-08-18 DIAGNOSIS — M545 Low back pain, unspecified: Secondary | ICD-10-CM | POA: Diagnosis not present

## 2021-08-18 DIAGNOSIS — G8929 Other chronic pain: Secondary | ICD-10-CM | POA: Diagnosis not present

## 2021-08-18 NOTE — Progress Notes (Signed)
Chief Complaint:   OBESITY Anne Ryan is here to discuss her progress with her obesity treatment plan along with follow-up of her obesity related diagnoses. Sarahbeth is on the Category 2 Plan and keeping a food journal and adhering to recommended goals of 1200 calories and 80 gms protein and states she is following her eating plan approximately 60% of the time. Cinnamon states she is currently going to physical therapy.  Today's visit was #: 2 Starting weight: 249 lbs Starting date: 07/28/21 Today's weight: 249 lbs Today's date: 08/10/21 Total lbs lost to date: 0 Total lbs lost since last in-office visit: 0  Interim History: She is down 2 pounds from her first visit.  She states that she has been out of town recently.    Subjective:   1. Vitamin D deficiency Vitamin D is 25.2.  2. Elevated cholesterol Taking fish oil and flaxseed.  3. Elevated TSH No diagnosis of hypothyroidism.  Had been on steroids.   Assessment/Plan:   1. Vitamin D deficiency Vitamin D is 25.2.  2. Elevated cholesterol Taking fish oil and flaxseed.  3. Elevated TSH Recheck your TSH in 8 weeks.  4. Obesity, current BMI 39.0 1.  Meal planning 2.  Intentional eating 3.  Increase protein 4.  Reviewed labs from 07/28/2021 (CMP, lipids, vitamin D level, A1c, fasting insulin, thyroid panel).  Anne Ryan is currently in the action stage of change. As such, her goal is to continue with weight loss efforts. She has agreed to the Category 2 Plan and keeping a food journal and adhering to recommended goals of 1200 calories and 80 gms protein .   Exercise goals: Continue PT and walking.  Behavioral modification strategies: increasing lean protein intake, decreasing simple carbohydrates, increasing vegetables, increasing water intake, meal planning and cooking strategies, keeping healthy foods in the home, ways to avoid boredom eating, ways to avoid night time snacking, and planning for success.  Anne Ryan has agreed to  follow-up with our clinic in 2 weeks. She was informed of the importance of frequent follow-up visits to maximize her success with intensive lifestyle modifications for her multiple health conditions.   Objective:   Blood pressure 115/77, pulse 64, temperature 98 F (36.7 C), height 5\' 7"  (1.702 m), weight 249 lb (112.9 kg), SpO2 98 %. Body mass index is 39 kg/m.  General: Cooperative, alert, well developed, in no acute distress. HEENT: Conjunctivae and lids unremarkable. Cardiovascular: Regular rhythm.  Lungs: Normal work of breathing. Neurologic: No focal deficits.   Lab Results  Component Value Date   CREATININE 0.87 07/28/2021   BUN 25 (H) 07/28/2021   NA 141 07/28/2021   K 4.0 07/28/2021   CL 99 07/28/2021   CO2 25 07/28/2021   Lab Results  Component Value Date   ALT 39 (H) 07/28/2021   AST 22 07/28/2021   ALKPHOS 88 07/28/2021   BILITOT 0.6 07/28/2021   Lab Results  Component Value Date   HGBA1C 5.5 07/28/2021   HGBA1C 5.6 12/21/2020   HGBA1C 5.5 10/03/2019   HGBA1C 5.8 09/05/2018   HGBA1C 5.5 04/06/2017   Lab Results  Component Value Date   INSULIN 10.7 07/28/2021   Lab Results  Component Value Date   TSH 7.530 (H) 07/28/2021   Lab Results  Component Value Date   CHOL 201 (H) 07/28/2021   HDL 57 07/28/2021   LDLCALC 124 (H) 07/28/2021   TRIG 111 07/28/2021   CHOLHDL 4 12/21/2020   Lab Results  Component Value Date  VD25OH 25.2 (L) 07/28/2021   Lab Results  Component Value Date   WBC 5.2 03/28/2016   HGB 13.8 03/28/2016   HCT 40.3 03/28/2016   MCV 85.9 03/28/2016   PLT 280.0 03/28/2016   No results found for: "IRON", "TIBC", "FERRITIN"   Attestation Statements:   Reviewed by clinician on day of visit: allergies, medications, problem list, medical history, surgical history, family history, social history, and previous encounter notes.  I, Dawn Whitmire, FNP-C, am acting as transcriptionist for Dr. Corinna Capra.  I have reviewed the  above documentation for accuracy and completeness, and I agree with the above. Corinna Capra, DO

## 2021-08-20 ENCOUNTER — Encounter (INDEPENDENT_AMBULATORY_CARE_PROVIDER_SITE_OTHER): Payer: Self-pay | Admitting: Bariatrics

## 2021-08-25 DIAGNOSIS — M545 Low back pain, unspecified: Secondary | ICD-10-CM | POA: Diagnosis not present

## 2021-08-25 DIAGNOSIS — G8929 Other chronic pain: Secondary | ICD-10-CM | POA: Diagnosis not present

## 2021-08-31 ENCOUNTER — Encounter (INDEPENDENT_AMBULATORY_CARE_PROVIDER_SITE_OTHER): Payer: Self-pay | Admitting: Family Medicine

## 2021-08-31 ENCOUNTER — Ambulatory Visit (INDEPENDENT_AMBULATORY_CARE_PROVIDER_SITE_OTHER): Payer: BC Managed Care – PPO | Admitting: Family Medicine

## 2021-08-31 VITALS — BP 129/83 | HR 64 | Temp 98.1°F | Ht 67.0 in | Wt 250.0 lb

## 2021-08-31 DIAGNOSIS — E559 Vitamin D deficiency, unspecified: Secondary | ICD-10-CM | POA: Diagnosis not present

## 2021-08-31 DIAGNOSIS — E669 Obesity, unspecified: Secondary | ICD-10-CM | POA: Diagnosis not present

## 2021-08-31 DIAGNOSIS — Z6839 Body mass index (BMI) 39.0-39.9, adult: Secondary | ICD-10-CM | POA: Diagnosis not present

## 2021-08-31 MED ORDER — VITAMIN D (ERGOCALCIFEROL) 1.25 MG (50000 UNIT) PO CAPS: 50000.0000 [IU] | ORAL_CAPSULE | ORAL | 0 refills | Status: DC

## 2021-09-01 ENCOUNTER — Telehealth: Payer: Self-pay

## 2021-09-01 DIAGNOSIS — M545 Low back pain, unspecified: Secondary | ICD-10-CM | POA: Diagnosis not present

## 2021-09-01 DIAGNOSIS — G8929 Other chronic pain: Secondary | ICD-10-CM | POA: Diagnosis not present

## 2021-09-01 NOTE — Telephone Encounter (Signed)
I called the pharmacy and informed them that the PA was done and was approved from 08/26/2021-12/29/2021, they understood and stated that they had to order.  Beau Vanduzer,cma

## 2021-09-07 NOTE — Progress Notes (Signed)
Chief Complaint:   OBESITY Anne Ryan is here to discuss her progress with her obesity treatment plan along with follow-up of her obesity related diagnoses. Anne Ryan is on the Category 2 Plan and keeping a food journal and adhering to recommended goals of 1200 calories and 880 grams protein and states she is following her eating plan approximately 85% of the time. [name] states she is not currently exercising.  Today's visit was #: 3 Starting weight: 249 lbs Starting date: 07/28/2021 Today's weight: 250 lbs Today's date: 08/31/2021 Total lbs lost to date: 0 Total lbs lost since last in-office visit: +1  Interim History: This is Anne Ryan's first OV with me. She was previously seen by Dr. Manson Passey. Pt is very hungry. Food recall appears appropriate and she is weighing her proteins and measuring vegetables.   Subjective:   1. Vitamin D deficiency Anne Ryan started prescription Vit D at her last OV. Anne Ryan is tolerating medication(s) well without side effects.  Medication compliance is good as patient endorses taking it as prescribed.  The patient denies additional concerns regarding this condition.      Assessment/Plan:  No orders of the defined types were placed in this encounter.   Medications Discontinued During This Encounter  Medication Reason   Vitamin D, Ergocalciferol, (DRISDOL) 1.25 MG (50000 UNIT) CAPS capsule Reorder     Meds ordered this encounter  Medications   Vitamin D, Ergocalciferol, (DRISDOL) 1.25 MG (50000 UNIT) CAPS capsule    Sig: Take 1 capsule (50,000 Units total) by mouth every 7 (seven) days.    Dispense:  5 capsule    Refill:  0     1. Vitamin D deficiency Low Vitamin D level contributes to fatigue and are associated with obesity, breast, and colon cancer. She agrees to continue to take prescription Vitamin D @50 ,000 IU every week and will follow-up for routine testing of Vitamin D, at least 2-3 times per year to avoid over-replacement.  Refill- Vitamin D,  Ergocalciferol, (DRISDOL) 1.25 MG (50000 UNIT) CAPS capsule; Take 1 capsule (50,000 Units total) by mouth every 7 (seven) days.  Dispense: 5 capsule; Refill: 0  2. Obesity, current BMI 39.2 [name] is currently in the action stage of change. As such, her goal is to continue with weight loss efforts. She has agreed to change to the Category 3 Plan with lunch options and 200 snack calories.   Extensive discussion with pt regarding meal plan and why it is beneficial.  Exercise goals:  As is  Behavioral modification strategies: increasing lean protein intake, decreasing simple carbohydrates, and planning for success.  [name] has agreed to follow-up with our clinic in 2-3 weeks. She was informed of the importance of frequent follow-up visits to maximize her success with intensive lifestyle modifications for her multiple health conditions.   Objective:   Blood pressure 129/83, pulse 64, temperature 98.1 F (36.7 C), height 5\' 7"  (1.702 m), weight 250 lb (113.4 kg), SpO2 98 %. Body mass index is 39.16 kg/m.  General: Cooperative, alert, well developed, in no acute distress. HEENT: Conjunctivae and lids unremarkable. Cardiovascular: Regular rhythm.  Lungs: Normal work of breathing. Neurologic: No focal deficits.   Lab Results  Component Value Date   CREATININE 0.87 07/28/2021   BUN 25 (H) 07/28/2021   NA 141 07/28/2021   K 4.0 07/28/2021   CL 99 07/28/2021   CO2 25 07/28/2021   Lab Results  Component Value Date   ALT 39 (H) 07/28/2021   AST 22 07/28/2021  ALKPHOS 88 07/28/2021   BILITOT 0.6 07/28/2021   Lab Results  Component Value Date   HGBA1C 5.5 07/28/2021   HGBA1C 5.6 12/21/2020   HGBA1C 5.5 10/03/2019   HGBA1C 5.8 09/05/2018   HGBA1C 5.5 04/06/2017   Lab Results  Component Value Date   INSULIN 10.7 07/28/2021   Lab Results  Component Value Date   TSH 7.530 (H) 07/28/2021   Lab Results  Component Value Date   CHOL 201 (H) 07/28/2021   HDL 57 07/28/2021    LDLCALC 124 (H) 07/28/2021   TRIG 111 07/28/2021   CHOLHDL 4 12/21/2020   Lab Results  Component Value Date   VD25OH 25.2 (L) 07/28/2021   Lab Results  Component Value Date   WBC 5.2 03/28/2016   HGB 13.8 03/28/2016   HCT 40.3 03/28/2016   MCV 85.9 03/28/2016   PLT 280.0 03/28/2016    Attestation Statements:   Reviewed by clinician on day of visit: allergies, medications, problem list, medical history, surgical history, family history, social history, and previous encounter notes.  Time spent on visit including pre-visit chart review and post-visit care and charting was 30 minutes.   I, Kyung Rudd, BS, CMA, am acting as transcriptionist for [employer], DO.   I have reviewed the above documentation for accuracy and completeness, and I agree with the above. Carlye Grippe, D.O.  The 21st Century Cures Act was signed into law in 2016 which includes the topic of electronic health records.  This provides immediate access to information in MyChart.  This includes consultation notes, operative notes, office notes, lab results and pathology reports.  If you have any questions about what you read please let us know at your next visit so we can discuss your concerns and take corrective action if need be.  We are right here with you.

## 2021-09-15 DIAGNOSIS — M545 Low back pain, unspecified: Secondary | ICD-10-CM | POA: Diagnosis not present

## 2021-09-15 DIAGNOSIS — G8929 Other chronic pain: Secondary | ICD-10-CM | POA: Diagnosis not present

## 2021-09-22 ENCOUNTER — Encounter (INDEPENDENT_AMBULATORY_CARE_PROVIDER_SITE_OTHER): Payer: Self-pay | Admitting: Family Medicine

## 2021-09-22 ENCOUNTER — Ambulatory Visit (INDEPENDENT_AMBULATORY_CARE_PROVIDER_SITE_OTHER): Payer: BC Managed Care – PPO | Admitting: Family Medicine

## 2021-09-22 VITALS — BP 128/75 | HR 55 | Temp 97.8°F | Ht 67.0 in | Wt 249.0 lb

## 2021-09-22 DIAGNOSIS — E669 Obesity, unspecified: Secondary | ICD-10-CM

## 2021-09-22 DIAGNOSIS — E559 Vitamin D deficiency, unspecified: Secondary | ICD-10-CM

## 2021-09-22 DIAGNOSIS — R748 Abnormal levels of other serum enzymes: Secondary | ICD-10-CM | POA: Diagnosis not present

## 2021-09-22 DIAGNOSIS — E8881 Metabolic syndrome: Secondary | ICD-10-CM | POA: Diagnosis not present

## 2021-09-22 DIAGNOSIS — Z6839 Body mass index (BMI) 39.0-39.9, adult: Secondary | ICD-10-CM

## 2021-09-22 DIAGNOSIS — Z9189 Other specified personal risk factors, not elsewhere classified: Secondary | ICD-10-CM

## 2021-09-22 MED ORDER — VITAMIN D (ERGOCALCIFEROL) 1.25 MG (50000 UNIT) PO CAPS: 50000.0000 [IU] | ORAL_CAPSULE | ORAL | 0 refills | Status: DC

## 2021-09-23 DIAGNOSIS — G8929 Other chronic pain: Secondary | ICD-10-CM | POA: Diagnosis not present

## 2021-09-23 DIAGNOSIS — M545 Low back pain, unspecified: Secondary | ICD-10-CM | POA: Diagnosis not present

## 2021-09-24 NOTE — Progress Notes (Signed)
Chief Complaint:   OBESITY Anne Ryan is here to discuss her progress with her obesity treatment plan along with follow-up of her obesity related diagnoses. Anne Ryan is on the Category 3 Plan with lunch options and 200 calorie snacks and states she is following her eating plan approximately 80-85% of the time. Anne Ryan states she is not currently exercising.  Today's visit was #: 4 Starting weight: 249 lbs Starting date: 07/28/2021 Today's weight: 249 lbs Today's date: 09/22/2021 Total lbs lost to date: 1 lb Total lbs lost since last in-office visit: 1 lb  Interim History: Anne Ryan states that she can not eat all of her food, "too much". Breakfast is a protein shake, (Premier Protein), 3 eggs and a piece of Terrill Mohr 45 calories bread. Lunch is 4 ounces of lunch meat (ham or [place]), 2 pieces of bread with mustard. Dinner is 6-8 ounces of chicken or pork tenderloin - one cup and with one cup of black beans. Her snacks are cheese sticks (2). Kinnedy lost 5 lbs in fat and gained 4 lbs of muscle mass.  Subjective:   1. Elevated liver enzymes Yemaya's last ALT level was 39, initial visceral fate rating was 12 and is now at 10.  2. Insulin resistance Liani does report to have had some cravings for sugar, but is tolerable now. Her last fasting insulin level was 10.7 on 7/21/203.  3. Vitamin D deficiency  Aldona is tolerating medication(s) well without side effects.  Medication compliance is good as patient endorses taking it as prescribed.  The patient denies additional concerns regarding this condition.      4. At risk for impaired metabolic function Gudelia is at risk for impaired metabolic function due to her insulin resistance and obesity.  Assessment/Plan:  No orders of the defined types were placed in this encounter.   Medications Discontinued During This Encounter  Medication Reason   Vitamin D, Ergocalciferol, (DRISDOL) 1.25 MG (50000 UNIT) CAPS capsule Reorder     Meds ordered this  encounter  Medications   Vitamin D, Ergocalciferol, (DRISDOL) 1.25 MG (50000 UNIT) CAPS capsule    Sig: Take 1 capsule (50,000 Units total) by mouth every 7 (seven) days.    Dispense:  4 capsule    Refill:  0     1. Elevated liver enzymes We discussed the likely diagnosis of non-alcoholic fatty liver disease today and how this condition is obesity related. Anne Ryan was educated the importance of weight loss. Anne Ryan agreed to continue with her weight loss efforts with healthier diet and exercise as an essential part of her treatment plan.  Anne Ryan will continue with her personal nutrition plan and weight loss.  2. Insulin resistance Anne Ryan will continue to work on weight loss, exercise, and decreasing simple carbohydrates to help decrease the risk of diabetes. Anne Ryan agreed to follow-up with [place] as directed to closely monitor her progress.  Anne Ryan was provided with our Metformin handout.  3. Vitamin D deficiency - I again reiterated the importance of vitamin D (as well as calcium) to their health and wellbeing.  - I reviewed possible symptoms of low Vitamin D:  low energy, depressed mood, muscle aches, joint aches, osteoporosis etc. - low Vitamin D levels may be linked to an increased risk of cardiovascular events and even increased risk of cancers- such as colon and breast.  - ideal vitamin D levels reviewed with patient  - I recommend pt take a weekly prescription vit D - see script below   - Informed  patient this may be a lifelong thing, and she was encouraged to continue to take the medicine until told otherwise.    - weight loss will likely improve availability of vitamin D, thus encouraged Anne Ryan to continue with meal plan and their weight loss efforts to further improve this condition.  Thus, we will need to monitor levels regularly (every 3-4 mo on average) to keep levels within normal limits and prevent over supplementation. - pt's questions and concerns regarding this condition addressed. Will  refill Vitamin D as follows:   - Vitamin D, Ergocalciferol, (DRISDOL) 1.25 MG (50000 UNIT) CAPS capsule; Take 1 capsule (50,000 Units total) by mouth every 7 (seven) days.  Dispense: 4 capsule; Refill: 0  4. At risk for impaired metabolic function Due to Anne Ryan's current state of health and medical condition(s), she is at a significantly higher risk for impaired metabolic function.   At least 23+ minutes was spent on counseling Anne Ryan about these concerns today.  This places the patient at a much greater risk to subsequently develop cardio-pulmonary conditions that can negatively affect the patient's quality of life.  I stressed the importance of reversing these risks factors.  The initial goal is to lose at least 5-10% of starting weight to help reduce risk factors.  Counseling:  Intensive lifestyle modifications discussed with Anne Ryan as the most appropriate first line treatment.  she will continue to work on diet, exercise, and weight loss efforts.  We will continue to reassess these conditions on a fairly regular basis in an attempt to decrease the patient's overall morbidity and mortality.   5. Obesity, current BMI 39.1 Anne Ryan is currently in the action stage of change. As such, her goal is to continue with weight loss efforts. She has agreed to the Category 3 Plan with lunch options and 200 calories snacks.  Exercise goals: Chirsty's goal is to walk 15-20 minutes every other day.  Behavioral modification strategies: decreasing simple carbohydrates, increasing water intake, no skipping meals, and planning for success.  Anne Ryan has agreed to follow-up with our clinic in 2-3 weeks. She was informed of the importance of frequent follow-up visits to maximize her success with intensive lifestyle modifications for her multiple health conditions.   Objective:   Blood pressure 128/75, pulse (!) 55, temperature 97.8 F (36.6 C), height 5\' 7"  (1.702 m), weight 249 lb (112.9 kg), SpO2 97 %. Body mass index is  39 kg/m.  General: Cooperative, alert, well developed, in no acute distress. HEENT: Conjunctivae and lids unremarkable. Cardiovascular: Regular rhythm.  Lungs: Normal work of breathing. Neurologic: No focal deficits.   Lab Results  Component Value Date   CREATININE 0.87 07/28/2021   BUN 25 (H) 07/28/2021   NA 141 07/28/2021   K 4.0 07/28/2021   CL 99 07/28/2021   CO2 25 07/28/2021   Lab Results  Component Value Date   ALT 39 (H) 07/28/2021   AST 22 07/28/2021   ALKPHOS 88 07/28/2021   BILITOT 0.6 07/28/2021   Lab Results  Component Value Date   HGBA1C 5.5 07/28/2021   HGBA1C 5.6 12/21/2020   HGBA1C 5.5 10/03/2019   HGBA1C 5.8 09/05/2018   HGBA1C 5.5 04/06/2017   Lab Results  Component Value Date   INSULIN 10.7 07/28/2021   Lab Results  Component Value Date   TSH 7.530 (H) 07/28/2021   Lab Results  Component Value Date   CHOL 201 (H) 07/28/2021   HDL 57 07/28/2021   LDLCALC 124 (H) 07/28/2021   TRIG 111 07/28/2021  CHOLHDL 4 12/21/2020   Lab Results  Component Value Date   VD25OH 25.2 (L) 07/28/2021   Lab Results  Component Value Date   WBC 5.2 03/28/2016   HGB 13.8 03/28/2016   HCT 40.3 03/28/2016   MCV 85.9 03/28/2016   PLT 280.0 03/28/2016   No results found for: "IRON", "TIBC", "FERRITIN"  Attestation Statements:   Reviewed by clinician on day of visit: allergies, medications, problem list, medical history, surgical history, family history, social history, and previous encounter notes.  IPaulla Fore, CMA, am acting as transcriptionist for Dr. Sharee Holster, DO.   I have reviewed the above documentation for accuracy and completeness, and I agree with the above. Carlye Grippe, D.O.  The 21st Century Cures Act was signed into law in 2016 which includes the topic of electronic health records.  This provides immediate access to information in MyChart.  This includes consultation notes, operative notes, office notes, lab results and pathology  reports.  If you have any questions about what you read please let us know at your next visit so we can discuss your concerns and take corrective action if need be.  We are right here with you.

## 2021-09-26 ENCOUNTER — Ambulatory Visit (INDEPENDENT_AMBULATORY_CARE_PROVIDER_SITE_OTHER): Payer: BC Managed Care – PPO | Admitting: Family Medicine

## 2021-09-26 ENCOUNTER — Other Ambulatory Visit (HOSPITAL_COMMUNITY)
Admission: RE | Admit: 2021-09-26 | Discharge: 2021-09-26 | Disposition: A | Discharge status: Home / Self Care | Payer: BC Managed Care – PPO | Source: Ambulatory Visit | Attending: Family Medicine | Admitting: Family Medicine

## 2021-09-26 ENCOUNTER — Encounter: Payer: Self-pay | Admitting: Family Medicine

## 2021-09-26 VITALS — BP 120/80 | HR 65 | Temp 97.7°F | Ht 67.0 in | Wt 252.8 lb

## 2021-09-26 DIAGNOSIS — Z23 Encounter for immunization: Secondary | ICD-10-CM

## 2021-09-26 DIAGNOSIS — Z124 Encounter for screening for malignant neoplasm of cervix: Secondary | ICD-10-CM | POA: Diagnosis not present

## 2021-09-26 DIAGNOSIS — Z6839 Body mass index (BMI) 39.0-39.9, adult: Secondary | ICD-10-CM

## 2021-09-26 DIAGNOSIS — I10 Essential (primary) hypertension: Secondary | ICD-10-CM | POA: Diagnosis not present

## 2021-09-26 DIAGNOSIS — J309 Allergic rhinitis, unspecified: Secondary | ICD-10-CM | POA: Diagnosis not present

## 2021-09-26 NOTE — Assessment & Plan Note (Signed)
Encouraged diet and exercise.  She will continue to follow with the weight management clinic.

## 2021-09-26 NOTE — Patient Instructions (Signed)
Nice to see you!   

## 2021-09-26 NOTE — Assessment & Plan Note (Signed)
Well-controlled.  She will continue amlodipine 10 mg daily.  

## 2021-09-26 NOTE — Progress Notes (Signed)
Marikay Alar, MD Phone: [phone]  Anne Ryan is a 61 y.o. female who presents today for f/u.  HYPERTENSION Disease Monitoring Home BP Monitoring not checking Chest pain- no    Dyspnea- no Medications Compliance-  taking amlodipine.   Edema- no BMET    Component Value Date/Time   NA 141 07/28/2021 0850   K 4.0 07/28/2021 0850   CL 99 07/28/2021 0850   CO2 25 07/28/2021 0850   GLUCOSE 72 07/28/2021 0850   GLUCOSE 90 12/21/2020 0933   BUN 25 (H) 07/28/2021 0850   CREATININE 0.87 07/28/2021 0850   CREATININE 0.88 10/03/2019 1603   CALCIUM 10.1 07/28/2021 0850   Allergic rhinitis: Patient notes this gets her in the spring and fall.  She typically has congestion, sneezing, and postnasal drip.  She takes Claritin.  She has not been using her Flonase consistently.  Obesity: She is following with the weight loss clinic.  They have an 1800-calorie diet with lots of protein.  She is going to start walking for exercise.  More intense exercise is limited by recent back pain issues.  She notes they are advising her that she is losing fat.  Social History   Tobacco Use  Smoking Status Former  Smokeless Tobacco Never    Current Outpatient Medications on File Prior to Visit  Medication Sig Dispense Refill   amLODipine (NORVASC) 10 MG tablet TAKE 1 TABLET BY MOUTH EVERY DAY 90 tablet 1   Cyanocobalamin (VITAMIN B 12 PO) Take by mouth.     Flaxseed, Linseed, (FLAX SEED OIL) 1000 MG CAPS Take 1 capsule by mouth daily.     fluticasone (FLONASE) 50 MCG/ACT nasal spray Place 2 sprays into both nostrils daily. 16 g 6   loratadine (CLARITIN) 10 MG tablet TAKE 1 TABLET BY MOUTH EVERY DAY 90 tablet 1   Multiple Vitamin (THERA) TABS Take by mouth.     Omega-3 Fatty Acids (FISH OIL) 1000 MG CAPS Take by mouth.     Vitamin D, Ergocalciferol, (DRISDOL) 1.25 MG (50000 UNIT) CAPS capsule Take 1 capsule (50,000 Units total) by mouth every 7 (seven) days. 4 capsule 0   No current  facility-administered medications on file prior to visit.     ROS see history of present illness  Objective  Physical Exam Vitals:   09/26/21 1531  BP: 120/80  Pulse: 65  Temp: 97.7 F (36.5 C)  SpO2: 99%    BP Readings from Last 3 Encounters:  09/26/21 120/80  09/22/21 128/75  08/31/21 129/83   Wt Readings from Last 3 Encounters:  09/26/21 252 lb 12.8 oz (114.7 kg)  09/22/21 249 lb (112.9 kg)  08/31/21 250 lb (113.4 kg)    Physical Exam Constitutional:      General: She is not in acute distress.    Appearance: She is not diaphoretic.  Cardiovascular:     Rate and Rhythm: Normal rate and regular rhythm.     Heart sounds: Normal heart sounds.  Pulmonary:     Effort: Pulmonary effort is normal.     Breath sounds: Normal breath sounds.  Genitourinary:    Comments: Charlyne Mom, CMA served as chaperone, normal labia, normal vaginal mucosa, normal-appearing cervix, no cervical motion tenderness, no adnexal masses or tenderness Skin:    General: Skin is warm and dry.  Neurological:     Mental Status: She is alert.  Psychiatric:        Mood and Affect: Mood normal.      Assessment/Plan: Please see individual  problem list.  Problem List Items Addressed This Visit     Allergic rhinitis (Chronic)    She will continue Claritin 10 mg daily.  She will restart her Flonase 2 sprays each nostril once daily.      HTN (hypertension) - Primary (Chronic)    Well-controlled.  She will continue amlodipine 10 mg daily.      Obesity (Chronic)    Encouraged diet and exercise.  She will continue to follow with the weight management clinic.      Other Visit Diagnoses     Need for immunization against influenza       Relevant Orders   Flu Vaccine QUAD 76mo+IM (Fluarix, Fluzone & Alfiuria Quad PF) (Completed)   Cervical cancer screening       Relevant Orders   Cytology - PAP( [place])   Need for tetanus booster       Relevant Orders   Td : Tetanus/diphtheria >7yo  Preservative  free (Completed)      Patient was noted to have a positive depression screening at the weight management clinic.  I discussed this with her today and she notes she has not felt depressed in the last 6 months.  She has not had thoughts of harming herself.  She was encouraged to discuss this depression screening with the weight management clinic and see if they can remove it from her chart since it seems as though this was inadvertently put in her chart.  Health Maintenance: Td given today.  Pap smear completed.  Return in about 6 months (around 03/27/2022).   Tommi Rumps, MD [place]

## 2021-09-26 NOTE — Assessment & Plan Note (Signed)
She will continue Claritin 10 mg daily.  She will restart her Flonase 2 sprays each nostril once daily.

## 2021-09-27 ENCOUNTER — Encounter (INDEPENDENT_AMBULATORY_CARE_PROVIDER_SITE_OTHER): Payer: Self-pay | Admitting: Family Medicine

## 2021-09-28 LAB — CYTOLOGY - PAP
Comment: NEGATIVE
Diagnosis: NEGATIVE
High risk HPV: NEGATIVE

## 2021-10-03 NOTE — Telephone Encounter (Signed)
Left message for patient to return call.

## 2021-10-13 ENCOUNTER — Ambulatory Visit (INDEPENDENT_AMBULATORY_CARE_PROVIDER_SITE_OTHER): Payer: BC Managed Care – PPO | Admitting: Family Medicine

## 2021-10-13 ENCOUNTER — Encounter (INDEPENDENT_AMBULATORY_CARE_PROVIDER_SITE_OTHER): Payer: Self-pay | Admitting: Family Medicine

## 2021-10-13 VITALS — BP 119/81 | HR 70 | Temp 98.0°F | Ht 67.0 in | Wt 251.0 lb

## 2021-10-13 DIAGNOSIS — E559 Vitamin D deficiency, unspecified: Secondary | ICD-10-CM | POA: Diagnosis not present

## 2021-10-13 DIAGNOSIS — Z1331 Encounter for screening for depression: Secondary | ICD-10-CM | POA: Diagnosis not present

## 2021-10-13 DIAGNOSIS — R7303 Prediabetes: Secondary | ICD-10-CM | POA: Diagnosis not present

## 2021-10-13 DIAGNOSIS — E669 Obesity, unspecified: Secondary | ICD-10-CM | POA: Diagnosis not present

## 2021-10-13 DIAGNOSIS — Z6839 Body mass index (BMI) 39.0-39.9, adult: Secondary | ICD-10-CM

## 2021-10-13 MED ORDER — VITAMIN D (ERGOCALCIFEROL) 1.25 MG (50000 UNIT) PO CAPS: 50000.0000 [IU] | ORAL_CAPSULE | ORAL | 0 refills | Status: DC

## 2021-11-07 DIAGNOSIS — Z1211 Encounter for screening for malignant neoplasm of colon: Secondary | ICD-10-CM | POA: Insufficient documentation

## 2021-11-07 DIAGNOSIS — Z1331 Encounter for screening for depression: Secondary | ICD-10-CM | POA: Insufficient documentation

## 2021-11-10 ENCOUNTER — Ambulatory Visit (INDEPENDENT_AMBULATORY_CARE_PROVIDER_SITE_OTHER): Payer: BC Managed Care – PPO | Admitting: Family Medicine

## 2021-11-10 ENCOUNTER — Encounter (INDEPENDENT_AMBULATORY_CARE_PROVIDER_SITE_OTHER): Payer: Self-pay | Admitting: Family Medicine

## 2021-11-10 VITALS — BP 125/75 | HR 65 | Temp 97.9°F | Ht 67.0 in | Wt 249.0 lb

## 2021-11-10 DIAGNOSIS — Z6839 Body mass index (BMI) 39.0-39.9, adult: Secondary | ICD-10-CM

## 2021-11-10 DIAGNOSIS — R7303 Prediabetes: Secondary | ICD-10-CM | POA: Insufficient documentation

## 2021-11-10 DIAGNOSIS — E669 Obesity, unspecified: Secondary | ICD-10-CM | POA: Diagnosis not present

## 2021-11-10 DIAGNOSIS — E559 Vitamin D deficiency, unspecified: Secondary | ICD-10-CM | POA: Diagnosis not present

## 2021-11-11 LAB — VITAMIN D 25 HYDROXY (VIT D DEFICIENCY, FRACTURES): Vit D, 25-Hydroxy: 43 ng/mL (ref 30.0–100.0)

## 2021-11-15 NOTE — Progress Notes (Signed)
Chief Complaint:   OBESITY Anne Ryan is here to discuss her progress with her obesity treatment plan along with follow-up of her obesity related diagnoses. Anne Ryan is on Category 3 Plan with lunch options and 200 calories snacks. and states she is following her eating plan approximately 80% of the time. Anne Ryan states she is walking 20 minutes 4 times per week.  Today's visit was #: 5 Starting weight: 249 lbs Starting date: 07/28/2021 Today's weight: 251 lbs Today's date: 10/13/2021 Total lbs lost to date: 0 Total lbs lost since last in-office visit: +2 lbs  Interim History: She went on a cruise last week. She is not surprised she gained, but her goal was to walk 15-20 minutes, 4 or more days per week and she did. She gained in muscle and lost fat mass.  She tried to focus on increasing protein intake and controlling portions.   Subjective:   1. Vitamin D deficiency Labs 07/28/2021, Vitamin D 25.2, not a goal.    2. Prediabetes A1c 5.8, 09/05/2018.  No medications in the past.  She declines needs for them at this time.  She denies hunger or cravings that are uncontrolled.   3. Obesity related depression screening Patient with questions regarding depression screening as a diagnosis code.  When she saw Dr Manson Passey in the past, I reviewed patients new patient paperwork from 08/18.  It showed a PHQ9: 23.  She denies mood disorder and negative depressed mood despite high PHQ number.  Negative history of mood depression or GAD.   Assessment/Plan:  No orders of the defined types were placed in this encounter.   Medications Discontinued During This Encounter  Medication Reason   Vitamin D, Ergocalciferol, (DRISDOL) 1.25 MG (50000 UNIT) CAPS capsule Reorder     Meds ordered this encounter  Medications   Vitamin D, Ergocalciferol, (DRISDOL) 1.25 MG (50000 UNIT) CAPS capsule    Sig: Take 1 capsule (50,000 Units total) by mouth every 7 (seven) days.    Dispense:  4 capsule    Refill:  0      1. Vitamin D deficiency - I again reiterated the importance of vitamin D (as well as calcium) to their health and wellbeing.  - I reviewed possible symptoms of low Vitamin D:  low energy, depressed mood, muscle aches, joint aches, osteoporosis etc. - low Vitamin D levels may be linked to an increased risk of cardiovascular events and even increased risk of cancers- such as colon and breast.  - ideal vitamin D levels reviewed with patient  - I recommend pt take a 50,000 IU weekly prescription vit D - see script below   - Informed patient this may be a lifelong thing, and she was encouraged to continue to take the medicine until told otherwise.    - weight loss will likely improve availability of vitamin D, thus encouraged Anne Ryan to continue with meal plan and their weight loss efforts to further improve this condition.  Thus, we will need to monitor levels regularly (every 3-4 mo on average) to keep levels within normal limits and prevent over supplementation. - pt's questions and concerns regarding this condition addressed.  Refill - Vitamin D, Ergocalciferol, (DRISDOL) 1.25 MG (50000 UNIT) CAPS capsule; Take 1 capsule (50,000 Units total) by mouth every 7 (seven) days.  Dispense: 4 capsule; Refill: 0  2. Prediabetes Continue weight loss via PNP, continue to increase walking as tolerated.  Check labs in near future.  Check fasting insulin, TSH, Free T4, CMP, FLP .  3. Obesity related depression screening Explained to patient that utility of our obesity depression screen and how depression is a high risk in obese patients.  She denies this is not an issue for her. She denies any suicidal or homicidal ideations.  I recommend she continue weight loss via PNP which will continue to improve mood.  Will monitor closely. Follow up with PCP as they deem necessary.  Showed patient the results of the questionnaire she filled out.   4. Obesity,current BMI 39.4 Patient will journal her intake for next  office visit.  No change in meal plan.  Patient will need fasting blood work mid to late November.   Anne Ryan is currently in the action stage of change. As such, her goal is to continue with weight loss efforts. She has agreed to the Category 3 Plan+ 200 snack calories and keeping a food journal and adhering to recommended goals of 1500-1600 calories and 110+ protein daily.    Exercise goals:  As is.   Behavioral modification strategies: increasing lean protein intake, decreasing simple carbohydrates, and keeping a strict food journal.  Anne Ryan has agreed to follow-up with our clinic in 3 weeks. She was informed of the importance of frequent follow-up visits to maximize her success with intensive lifestyle modifications for her multiple health conditions.   Objective:   Blood pressure 119/81, pulse 70, temperature 98 F (36.7 C), height 5\' 7"  (1.702 m), weight 251 lb (113.9 kg), SpO2 99 %. Body mass index is 39.31 kg/m.  General: Cooperative, alert, well developed, in no acute distress. HEENT: Conjunctivae and lids unremarkable. Cardiovascular: Regular rhythm.  Lungs: Normal work of breathing. Neurologic: No focal deficits.   Lab Results  Component Value Date   CREATININE 0.87 07/28/2021   BUN 25 (H) 07/28/2021   NA 141 07/28/2021   K 4.0 07/28/2021   CL 99 07/28/2021   CO2 25 07/28/2021   Lab Results  Component Value Date   ALT 39 (H) 07/28/2021   AST 22 07/28/2021   ALKPHOS 88 07/28/2021   BILITOT 0.6 07/28/2021   Lab Results  Component Value Date   HGBA1C 5.5 07/28/2021   HGBA1C 5.6 12/21/2020   HGBA1C 5.5 10/03/2019   HGBA1C 5.8 09/05/2018   HGBA1C 5.5 04/06/2017   Lab Results  Component Value Date   INSULIN 10.7 07/28/2021   Lab Results  Component Value Date   TSH 7.530 (H) 07/28/2021   Lab Results  Component Value Date   CHOL 201 (H) 07/28/2021   HDL 57 07/28/2021   LDLCALC 124 (H) 07/28/2021   TRIG 111 07/28/2021   CHOLHDL 4 12/21/2020   Lab Results   Component Value Date   VD25OH 43.0 11/10/2021   VD25OH 25.2 (L) 07/28/2021   Lab Results  Component Value Date   WBC 5.2 03/28/2016   HGB 13.8 03/28/2016   HCT 40.3 03/28/2016   MCV 85.9 03/28/2016   PLT 280.0 03/28/2016   No results found for: "IRON", "TIBC", "FERRITIN"  Attestation Statements:   Reviewed by clinician on day of visit: allergies, medications, problem list, medical history, surgical history, family history, social history, and previous encounter notes.  Time spent on visit including pre-visit chart review and post-visit care and charting was 40 minutes.   I, Davy Pique, RMA, am acting as Location manager for [employer], DO.   I have reviewed the above documentation for accuracy and completeness, and I agree with the above. Marjory Sneddon, D.O.  The 21st Century Cures Act was signed  into law in 2016 which includes the topic of electronic health records.  This provides immediate access to information in MyChart.  This includes consultation notes, operative notes, office notes, lab results and pathology reports.  If you have any questions about what you read please let us know at your next visit so we can discuss your concerns and take corrective action if need be.  We are right here with you.

## 2021-11-23 NOTE — Progress Notes (Signed)
Chief Complaint:   OBESITY Anne Ryan is here to discuss her progress with her obesity treatment plan along with follow-up of her obesity related diagnoses. [name] is on the Category 3 Plan with lunch options and 200 snack calories and states she is following her eating plan approximately 80% of the time. [name] states she is walking 15 minutes 3 times per week.  Today's visit was #: 6 Starting weight: 249 lbs Starting date: 07/28/2021 Today's weight: 249 lbs Today's date: 11/10/2021 Total lbs lost to date: 0 Total lbs lost since last in-office visit: 2  Interim History: [name] did download "Lose It" and started tracking on October 2nd. She averaged 1359 and 1445 calories and 112 and 150 grams per week for the past couple of weeks (we don't know daily).  Subjective:   1. Pre-diabetes Anne Ryan has a diagnosis of prediabetes based on her elevated HgA1c and was informed this puts her at greater risk of developing diabetes. She continues to work on diet and exercise to decrease her risk of diabetes. She denies nausea or hypoglycemia. Medication: None  2. Vitamin D deficiency Anne Ryan is tolerating medication(s) well without side effects.  Medication compliance is good as patient endorses taking it as prescribed.  The patient denies additional concerns regarding this condition.      Assessment/Plan:   Orders Placed This Encounter  Procedures   VITAMIN D 25 Hydroxy (Vit-D Deficiency, Fractures)    There are no discontinued medications.   No orders of the defined types were placed in this encounter.    1. Pre-diabetes Anne Ryan will continue to work on weight loss, exercise, follow prudent nutritional plan, increase protein and fiber, and decreasing simple carbohydrates to help decrease the risk of diabetes.  Pt declines A1c and fasting insulin labs at this time.   2. Vitamin D deficiency Low Vitamin D level contributes to fatigue and are associated with obesity, breast, and colon cancer.  She agrees to continue to take prescription Vitamin D 50,000 IU every week, as pt states [facility] [place] already filled it. Pt will follow-up for routine testing of Vitamin D, at least 2-3 times per year to avoid over-replacement.  Lab/Orders today or future: - VITAMIN D 25 Hydroxy (Vit-D Deficiency, Fractures)  3. Obesity,current BMI 39.1 Anne Ryan is currently in the action stage of change. As such, her goal is to continue with weight loss efforts. She has agreed to decrease to keeping a food journal and adhering to recommended goals of 1400 calories and 130 grams protein.   Bring in daily log to next OV to help become more aware of dietary habits.  Exercise goals:  As is  Behavioral modification strategies: increasing lean protein intake, planning for success, and keeping a strict food journal.  Anne Ryan has agreed to follow-up with our clinic in 3-4 weeks with Shawn, PA-C. She was informed of the importance of frequent follow-up visits to maximize her success with intensive lifestyle modifications for her multiple health conditions.   Anne Ryan was informed we would discuss her lab results at her next visit unless there is a critical issue that needs to be addressed sooner. Anne Ryan agreed to keep her next visit at the agreed upon time to discuss these results.  Objective:   Blood pressure 125/75, pulse 65, temperature 97.9 F (36.6 C), height 5\' 7"  (1.702 m), weight 249 lb (112.9 kg), SpO2 98 %. Body mass index is 39 kg/m.  General: Cooperative, alert, well developed, in no acute distress. HEENT: Conjunctivae  and lids unremarkable. Cardiovascular: Regular rhythm.  Lungs: Normal work of breathing. Neurologic: No focal deficits.   Lab Results  Component Value Date   CREATININE 0.87 07/28/2021   BUN 25 (H) 07/28/2021   NA 141 07/28/2021   K 4.0 07/28/2021   CL 99 07/28/2021   CO2 25 07/28/2021   Lab Results  Component Value Date   ALT 39 (H) 07/28/2021   AST 22 07/28/2021    ALKPHOS 88 07/28/2021   BILITOT 0.6 07/28/2021   Lab Results  Component Value Date   HGBA1C 5.5 07/28/2021   HGBA1C 5.6 12/21/2020   HGBA1C 5.5 10/03/2019   HGBA1C 5.8 09/05/2018   HGBA1C 5.5 04/06/2017   Lab Results  Component Value Date   INSULIN 10.7 07/28/2021   Lab Results  Component Value Date   TSH 7.530 (H) 07/28/2021   Lab Results  Component Value Date   CHOL 201 (H) 07/28/2021   HDL 57 07/28/2021   LDLCALC 124 (H) 07/28/2021   TRIG 111 07/28/2021   CHOLHDL 4 12/21/2020   Lab Results  Component Value Date   VD25OH 43.0 11/10/2021   VD25OH 25.2 (L) 07/28/2021   Lab Results  Component Value Date   WBC 5.2 03/28/2016   HGB 13.8 03/28/2016   HCT 40.3 03/28/2016   MCV 85.9 03/28/2016   PLT 280.0 03/28/2016    Attestation Statements:   Reviewed by clinician on day of visit: allergies, medications, problem list, medical history, surgical history, family history, social history, and previous encounter notes.  I, Kathlene November, BS, CMA, am acting as transcriptionist for [employer], DO.   I have reviewed the above documentation for accuracy and completeness, and I agree with the above. Marjory Sneddon, D.O.  The [place] was signed into law in 2016 which includes the topic of electronic health records.  This provides immediate access to information in MyChart.  This includes consultation notes, operative notes, office notes, lab results and pathology reports.  If you have any questions about what you read please let us know at your next visit so we can discuss your concerns and take corrective action if need be.  We are right here with you.

## 2021-12-08 ENCOUNTER — Ambulatory Visit (INDEPENDENT_AMBULATORY_CARE_PROVIDER_SITE_OTHER): Payer: BC Managed Care – PPO | Admitting: Physician Assistant

## 2021-12-16 ENCOUNTER — Other Ambulatory Visit: Payer: Self-pay | Admitting: Family Medicine

## 2022-02-14 ENCOUNTER — Other Ambulatory Visit: Payer: Self-pay | Admitting: Family Medicine

## 2022-03-27 ENCOUNTER — Encounter: Payer: Self-pay | Admitting: Family Medicine

## 2022-03-27 ENCOUNTER — Ambulatory Visit (INDEPENDENT_AMBULATORY_CARE_PROVIDER_SITE_OTHER): Payer: BC Managed Care – PPO | Admitting: Family Medicine

## 2022-03-27 VITALS — BP 122/82 | HR 60 | Temp 98.0°F | Ht 67.0 in | Wt 254.0 lb

## 2022-03-27 DIAGNOSIS — G8929 Other chronic pain: Secondary | ICD-10-CM

## 2022-03-27 DIAGNOSIS — I10 Essential (primary) hypertension: Secondary | ICD-10-CM

## 2022-03-27 DIAGNOSIS — M546 Pain in thoracic spine: Secondary | ICD-10-CM | POA: Diagnosis not present

## 2022-03-27 DIAGNOSIS — R7303 Prediabetes: Secondary | ICD-10-CM | POA: Diagnosis not present

## 2022-03-27 DIAGNOSIS — E559 Vitamin D deficiency, unspecified: Secondary | ICD-10-CM

## 2022-03-27 DIAGNOSIS — Z6839 Body mass index (BMI) 39.0-39.9, adult: Secondary | ICD-10-CM

## 2022-03-27 LAB — VITAMIN D 25 HYDROXY (VIT D DEFICIENCY, FRACTURES): VITD: 48.91 ng/mL (ref 30.00–100.00)

## 2022-03-27 LAB — HEMOGLOBIN A1C: Hgb A1c MFr Bld: 5.7 % (ref 4.6–6.5)

## 2022-03-27 NOTE — Assessment & Plan Note (Signed)
Chronic issue.  Occasionally has spasms.  Discussed massage and stretching when they occur.  Discussed she could use her TENS unit once daily to see if that provides benefit.

## 2022-03-27 NOTE — Progress Notes (Signed)
Tommi Rumps, MD Phone: [phone]  Anne Ryan is a 62 y.o. female who presents today for f/u  HYPERTENSION Disease Monitoring Home BP Monitoring not checking Chest pain- no    Dyspnea- no Medications Compliance-  taking amlodipine  Edema- no BMET    Component Value Date/Time   NA 141 07/28/2021 0850   K 4.0 07/28/2021 0850   CL 99 07/28/2021 0850   CO2 25 07/28/2021 0850   GLUCOSE 72 07/28/2021 0850   GLUCOSE 90 12/21/2020 0933   BUN 25 (H) 07/28/2021 0850   CREATININE 0.87 07/28/2021 0850   CREATININE 0.88 10/03/2019 1603   CALCIUM 10.1 07/28/2021 0850   Obesity: Patient is walking a couple times a week when she has time.  She tried counting calories and protein when she was following with the weight management clinic though that was not super helpful.  She tries to keep her diet decent at this time.  Vitamin D deficiency: Patient was treated with 50,000 international units once weekly though when she stopped following with the weight loss clinic she went on 10,000 international units a day.  Thoracic back pain: This has been an ongoing issue.  She notes generally she will get a spasm in her mid right thoracic back if she uses her arms or stands for too long.  She uses a TENS unit with good benefit.  She tries to massage the area with some benefit as well.  She wonders if there is anything else she can do if she is not around her TENS unit.  She has seen physical therapy and orthopedics for this.  Social History   Tobacco Use  Smoking Status Former  Smokeless Tobacco Never    Current Outpatient Medications on File Prior to Visit  Medication Sig Dispense Refill   amLODipine (NORVASC) 10 MG tablet TAKE 1 TABLET BY MOUTH EVERY DAY 90 tablet 1   Cyanocobalamin (VITAMIN B 12 PO) Take by mouth.     fluticasone (FLONASE) 50 MCG/ACT nasal spray Place 2 sprays into both nostrils daily. 16 g 6   loratadine (CLARITIN) 10 MG tablet TAKE 1 TABLET BY MOUTH EVERY DAY 90 tablet 1    Multiple Vitamin (THERA) TABS Take by mouth.     Omega-3 Fatty Acids (FISH OIL) 1000 MG CAPS Take by mouth.     No current facility-administered medications on file prior to visit.     ROS see history of present illness  Objective  Physical Exam Vitals:   03/27/22 0805  BP: 122/82  Pulse: 60  Temp: 98 F (36.7 C)  SpO2: 97%    BP Readings from Last 3 Encounters:  03/27/22 122/82  11/10/21 125/75  10/13/21 119/81   Wt Readings from Last 3 Encounters:  03/27/22 254 lb (115.2 kg)  11/10/21 249 lb (112.9 kg)  10/13/21 251 lb (113.9 kg)    Physical Exam Constitutional:      General: She is not in acute distress.    Appearance: She is not diaphoretic.  Cardiovascular:     Rate and Rhythm: Normal rate and regular rhythm.     Heart sounds: Normal heart sounds.  Pulmonary:     Effort: Pulmonary effort is normal.     Breath sounds: Normal breath sounds.  Musculoskeletal:     Right lower leg: No edema.     Left lower leg: No edema.  Skin:    General: Skin is warm and dry.  Neurological:     Mental Status: She is alert.  Assessment/Plan: Please see individual problem list.  Primary hypertension Assessment & Plan: Chronic issue.  Adequately controlled.  She will continue amlodipine 10 mg daily.   Class 2 severe obesity due to excess calories with serious comorbidity and body mass index (BMI) of 39.0 to 39.9 in adult [facility]) Assessment & Plan: Chronic issue.  I encouraged healthy diet and exercise.  Continue to monitor.  Discussed there are benefits to healthy diet and exercise outside of weight loss.   Prediabetes Assessment & Plan: Chronic issue.  Check A1c.  Orders: -     Hemoglobin A1c  Chronic right-sided thoracic back pain Assessment & Plan: Chronic issue.  Occasionally has spasms.  Discussed massage and stretching when they occur.  Discussed she could use her TENS unit once daily to see if that provides benefit.   Vitamin D  deficiency Assessment & Plan: Chronic issue.  Check vitamin D.  Discussed we may have to reduce her daily dose.  Orders: -     VITAMIN D 25 Hydroxy (Vit-D Deficiency, Fractures)    Return in about 6 months (around 09/27/2022) for physical.   Tommi Rumps, MD [place]

## 2022-03-27 NOTE — Assessment & Plan Note (Signed)
Chronic issue.  I encouraged healthy diet and exercise.  Continue to monitor.  Discussed there are benefits to healthy diet and exercise outside of weight loss.

## 2022-03-27 NOTE — Assessment & Plan Note (Signed)
Chronic issue.  Check vitamin D.  Discussed we may have to reduce her daily dose.

## 2022-03-27 NOTE — Assessment & Plan Note (Signed)
Chronic issue.  Check A1c. 

## 2022-03-27 NOTE — Assessment & Plan Note (Signed)
Chronic issue.  Adequately controlled.  She will continue amlodipine 10 mg daily. 

## 2022-08-03 ENCOUNTER — Other Ambulatory Visit: Payer: Self-pay | Admitting: Family Medicine

## 2022-08-03 DIAGNOSIS — Z1231 Encounter for screening mammogram for malignant neoplasm of breast: Secondary | ICD-10-CM

## 2022-08-07 ENCOUNTER — Other Ambulatory Visit: Payer: Self-pay | Admitting: Family Medicine

## 2022-08-09 ENCOUNTER — Ambulatory Visit
Admission: RE | Admit: 2022-08-09 | Discharge: 2022-08-09 | Disposition: A | Discharge status: Home / Self Care | Payer: BC Managed Care – PPO | Source: Ambulatory Visit | Attending: Family Medicine | Admitting: Family Medicine

## 2022-08-09 DIAGNOSIS — Z1231 Encounter for screening mammogram for malignant neoplasm of breast: Secondary | ICD-10-CM | POA: Diagnosis not present

## 2022-08-24 ENCOUNTER — Encounter (INDEPENDENT_AMBULATORY_CARE_PROVIDER_SITE_OTHER): Payer: Self-pay

## 2022-08-29 ENCOUNTER — Ambulatory Visit (INDEPENDENT_AMBULATORY_CARE_PROVIDER_SITE_OTHER): Payer: BC Managed Care – PPO | Admitting: Family Medicine

## 2022-08-29 ENCOUNTER — Encounter: Payer: Self-pay | Admitting: Family Medicine

## 2022-08-29 VITALS — BP 130/82 | HR 69 | Temp 98.8°F | Ht 67.0 in | Wt 257.4 lb

## 2022-08-29 DIAGNOSIS — F32A Depression, unspecified: Secondary | ICD-10-CM | POA: Diagnosis not present

## 2022-08-29 DIAGNOSIS — R21 Rash and other nonspecific skin eruption: Secondary | ICD-10-CM | POA: Diagnosis not present

## 2022-08-29 DIAGNOSIS — R350 Frequency of micturition: Secondary | ICD-10-CM | POA: Diagnosis not present

## 2022-08-29 DIAGNOSIS — F419 Anxiety disorder, unspecified: Secondary | ICD-10-CM | POA: Diagnosis not present

## 2022-08-29 LAB — CBC WITH DIFFERENTIAL/PLATELET
Basophils Absolute: 0 10*3/uL (ref 0.0–0.1)
Basophils Relative: 0.6 % (ref 0.0–3.0)
Eosinophils Absolute: 0.1 10*3/uL (ref 0.0–0.7)
Eosinophils Relative: 0.8 % (ref 0.0–5.0)
HCT: 41.7 % (ref 36.0–46.0)
Hemoglobin: 13.9 g/dL (ref 12.0–15.0)
Lymphocytes Relative: 22.2 % (ref 12.0–46.0)
Lymphs Abs: 1.4 10*3/uL (ref 0.7–4.0)
MCHC: 33.3 g/dL (ref 30.0–36.0)
MCV: 89.3 fl (ref 78.0–100.0)
Monocytes Absolute: 0.9 10*3/uL (ref 0.1–1.0)
Monocytes Relative: 14.1 % — ABNORMAL HIGH (ref 3.0–12.0)
Neutro Abs: 3.8 10*3/uL (ref 1.4–7.7)
Neutrophils Relative %: 62.3 % (ref 43.0–77.0)
Platelets: 269 10*3/uL (ref 150.0–400.0)
RBC: 4.67 Mil/uL (ref 3.87–5.11)
RDW: 13.5 % (ref 11.5–15.5)
WBC: 6.1 10*3/uL (ref 4.0–10.5)

## 2022-08-29 LAB — POC URINALSYSI DIPSTICK (AUTOMATED)
Bilirubin, UA: NEGATIVE
Blood, UA: NEGATIVE
Glucose, UA: NEGATIVE
Ketones, UA: NEGATIVE
Leukocytes, UA: NEGATIVE
Nitrite, UA: NEGATIVE
Protein, UA: NEGATIVE
Spec Grav, UA: 1.02 (ref 1.010–1.025)
Urobilinogen, UA: 0.2 E.U./dL
pH, UA: 5.5 (ref 5.0–8.0)

## 2022-08-29 LAB — COMPREHENSIVE METABOLIC PANEL
ALT: 38 U/L — ABNORMAL HIGH (ref 0–35)
AST: 35 U/L (ref 0–37)
Albumin: 4.5 g/dL (ref 3.5–5.2)
Alkaline Phosphatase: 89 U/L (ref 39–117)
BUN: 24 mg/dL — ABNORMAL HIGH (ref 6–23)
CO2: 27 mEq/L (ref 19–32)
Calcium: 9.8 mg/dL (ref 8.4–10.5)
Chloride: 102 mEq/L (ref 96–112)
Creatinine, Ser: 0.81 mg/dL (ref 0.40–1.20)
GFR: 78.78 mL/min (ref >60.00)
Glucose, Bld: 91 mg/dL (ref 70–99)
Potassium: 3.4 mEq/L — ABNORMAL LOW (ref 3.5–5.1)
Sodium: 138 mEq/L (ref 135–145)
Total Bilirubin: 0.8 mg/dL (ref 0.2–1.2)
Total Protein: 7.8 g/dL (ref 6.0–8.3)

## 2022-08-29 LAB — SEDIMENTATION RATE: Sed Rate: 16 mm/hr (ref 0–30)

## 2022-08-29 MED ORDER — DOXYCYCLINE HYCLATE 100 MG PO TABS: 100.0000 mg | ORAL_TABLET | Freq: Two times a day (BID) | ORAL | 0 refills | Status: DC

## 2022-08-29 NOTE — Progress Notes (Signed)
Marikay Alar, MD Phone: [phone]  Anne Ryan is a 62 y.o. female who presents today for same-day visit.  Dysuria: Patient notes dysuria, frequency, and urgency for the last 4 to 6 weeks.  No hematuria or vaginal discharge.  Rash: Patient notes rash started on her legs yesterday morning.  She cut the grass the day before.  She notes no itching or pain.  No fevers.  No known tick bites.  Depression: Patient reports some anxiety and possibly some mild depression.  No she has been more emotional.  She is losing her job and they are having to sell their house.  This is a lot of life change for her.  She notes no SI.  Social History   Tobacco Use  Smoking Status Former  Smokeless Tobacco Never    Current Outpatient Medications on File Prior to Visit  Medication Sig Dispense Refill   amLODipine (NORVASC) 10 MG tablet TAKE 1 TABLET BY MOUTH EVERY DAY 150 tablet 0   Cyanocobalamin (VITAMIN B 12 PO) Take by mouth.     fluticasone (FLONASE) 50 MCG/ACT nasal spray Place 2 sprays into both nostrils daily. 16 g 6   loratadine (CLARITIN) 10 MG tablet TAKE 1 TABLET BY MOUTH EVERY DAY 90 tablet 1   Multiple Vitamin (THERA) TABS Take by mouth.     Omega-3 Fatty Acids (FISH OIL) 1000 MG CAPS Take by mouth.     No current facility-administered medications on file prior to visit.     ROS see history of present illness  Objective  Physical Exam Vitals:   08/29/22 1048  BP: 130/82  Pulse: 69  Temp: 98.8 F (37.1 C)  SpO2: 98%    BP Readings from Last 3 Encounters:  08/29/22 130/82  03/27/22 122/82  11/10/21 125/75   Wt Readings from Last 3 Encounters:  08/29/22 257 lb 6.4 oz (116.8 kg)  03/27/22 254 lb (115.2 kg)  11/10/21 249 lb (112.9 kg)    Physical Exam Constitutional:      General: She is not in acute distress.    Appearance: She is not diaphoretic.  Pulmonary:     Effort: Pulmonary effort is normal.  Abdominal:     General: Bowel sounds are normal. There is no  distension.     Palpations: Abdomen is soft.     Tenderness: There is no abdominal tenderness.  Skin:    General: Skin is warm and dry.     Comments: Petechial rash on the inside of both lower legs just above the ankles, nonblanching, nontender, no warmth  Neurological:     Mental Status: She is alert.      Assessment/Plan: Please see individual problem list.  Urine frequency Assessment & Plan: Urinalysis is unremarkable.  Will send for urine culture.  If urine culture is positive we will treat with antibiotics.  If urine culture is negative we will send the patient to urology.  Orders: -     POCT Urinalysis Dipstick (Automated) -     Urine Culture  Rash Assessment & Plan: New onset.  Concerning for vasculitis.  Discussed this could also represent rash from [facility] spotted fever.  Will check [facility] spotted fever lab work as well as other lab work for vasculitis.  We will start on doxycycline 100 mg twice daily for 7 days given that this could represent RMSF.  Advised if she develops any progressive symptoms or feel systemically ill she needs to let us know right away.  Orders: -  CBC with Differential/Platelet -     Comprehensive metabolic panel -     Sedimentation rate -     Rocky mtn spotted fvr abs pnl(IgG+IgM) -     Doxycycline Hyclate; Take 1 tablet (100 mg total) by mouth 2 (two) times daily.  Dispense: 14 tablet; Refill: 0  Anxiety and depression Assessment & Plan: The patient understandably has some anxiety and depression with losing her job and having to move out of her long-term house.  Discussed options of monitoring versus talking to a therapist versus taking medication.  Patient opted for a therapist and she will try to find this on her own.  If she has any difficulty she will let us know.     Return if symptoms worsen or fail to improve.   Marikay Alar, MD [facility]

## 2022-08-29 NOTE — Assessment & Plan Note (Signed)
New onset.  Concerning for vasculitis.  Discussed this could also represent rash from [facility] spotted fever.  Will check [facility] spotted fever lab work as well as other lab work for vasculitis.  We will start on doxycycline 100 mg twice daily for 7 days given that this could represent RMSF.  Advised if she develops any progressive symptoms or feel systemically ill she needs to let us know right away.

## 2022-08-29 NOTE — Assessment & Plan Note (Signed)
Urinalysis is unremarkable.  Will send for urine culture.  If urine culture is positive we will treat with antibiotics.  If urine culture is negative we will send the patient to urology.

## 2022-08-29 NOTE — Patient Instructions (Signed)
Nice to see you. If you have trouble finding a therapist please let us know. Will contact you when your lab results return.

## 2022-08-29 NOTE — Assessment & Plan Note (Signed)
The patient understandably has some anxiety and depression with losing her job and having to move out of her long-term house.  Discussed options of monitoring versus talking to a therapist versus taking medication.  Patient opted for a therapist and she will try to find this on her own.  If she has any difficulty she will let us know.

## 2022-08-30 LAB — URINE CULTURE
MICRO NUMBER:: 15355782
Result:: NO GROWTH
SPECIMEN QUALITY:: ADEQUATE

## 2022-09-02 LAB — ROCKY MTN SPOTTED FVR ABS PNL(IGG+IGM)
RMSF IgG: NOT DETECTED
RMSF IgM: NOT DETECTED

## 2022-09-27 ENCOUNTER — Encounter: Payer: Self-pay | Admitting: Family Medicine

## 2022-09-27 ENCOUNTER — Telehealth: Payer: Self-pay

## 2022-09-27 ENCOUNTER — Other Ambulatory Visit: Payer: Self-pay

## 2022-09-27 ENCOUNTER — Ambulatory Visit (INDEPENDENT_AMBULATORY_CARE_PROVIDER_SITE_OTHER): Payer: BC Managed Care – PPO | Admitting: Family Medicine

## 2022-09-27 VITALS — BP 116/74 | HR 62 | Temp 98.4°F | Ht 67.0 in | Wt 256.2 lb

## 2022-09-27 DIAGNOSIS — Z1211 Encounter for screening for malignant neoplasm of colon: Secondary | ICD-10-CM

## 2022-09-27 DIAGNOSIS — Z0001 Encounter for general adult medical examination with abnormal findings: Secondary | ICD-10-CM | POA: Diagnosis not present

## 2022-09-27 DIAGNOSIS — R7303 Prediabetes: Secondary | ICD-10-CM | POA: Diagnosis not present

## 2022-09-27 DIAGNOSIS — Z23 Encounter for immunization: Secondary | ICD-10-CM | POA: Diagnosis not present

## 2022-09-27 DIAGNOSIS — R600 Localized edema: Secondary | ICD-10-CM

## 2022-09-27 DIAGNOSIS — E7800 Pure hypercholesterolemia, unspecified: Secondary | ICD-10-CM | POA: Diagnosis not present

## 2022-09-27 DIAGNOSIS — T63421A Toxic effect of venom of ants, accidental (unintentional), initial encounter: Secondary | ICD-10-CM | POA: Insufficient documentation

## 2022-09-27 LAB — LIPID PANEL
Cholesterol: 176 mg/dL (ref 0–200)
HDL: 48.9 mg/dL (ref >39.00)
LDL Cholesterol: 101 mg/dL — ABNORMAL HIGH (ref 0–99)
NonHDL: 127.43
Total CHOL/HDL Ratio: 4
Triglycerides: 130 mg/dL (ref 0.0–149.0)
VLDL: 26 mg/dL (ref 0.0–40.0)

## 2022-09-27 LAB — COMPREHENSIVE METABOLIC PANEL WITH GFR
ALT: 27 U/L (ref 0–35)
AST: 23 U/L (ref 0–37)
Albumin: 4.5 g/dL (ref 3.5–5.2)
Alkaline Phosphatase: 80 U/L (ref 39–117)
BUN: 26 mg/dL — ABNORMAL HIGH (ref 6–23)
CO2: 29 meq/L (ref 19–32)
Calcium: 9.5 mg/dL (ref 8.4–10.5)
Chloride: 104 meq/L (ref 96–112)
Creatinine, Ser: 0.87 mg/dL (ref 0.40–1.20)
GFR: 72.26 mL/min (ref >60.00)
Glucose, Bld: 94 mg/dL (ref 70–99)
Potassium: 4.1 meq/L (ref 3.5–5.1)
Sodium: 140 meq/L (ref 135–145)
Total Bilirubin: 0.7 mg/dL (ref 0.2–1.2)
Total Protein: 7.2 g/dL (ref 6.0–8.3)

## 2022-09-27 LAB — HEMOGLOBIN A1C: Hgb A1c MFr Bld: 5.5 % (ref 4.6–6.5)

## 2022-09-27 MED ORDER — NA SULFATE-K SULFATE-MG SULF 17.5-3.13-1.6 GM/177ML PO SOLN
1.0000 | Freq: Once | ORAL | 0 refills | Status: AC
Start: 2022-09-27 — End: 2022-09-27

## 2022-09-27 NOTE — Assessment & Plan Note (Signed)
Bites appear to be healing well.  She will monitor.

## 2022-09-27 NOTE — Assessment & Plan Note (Signed)
Possible salivary gland enlargement on exam.  Ultrasound ordered today.

## 2022-09-27 NOTE — Progress Notes (Signed)
Marikay Alar, MD Phone: [phone]  Anne Ryan is a 62 y.o. female who presents today for CPE.  Diet: Patient has been trying to keep up protein intake, eating more vegetables, has started to reduce sugar and carbohydrate intake, no soda or sweet tea Exercise: 3 times a week for 20 to 30 minutes Pap smear: 09/26/2021 negative HPV, NILM Colonoscopy: Due Mammogram: 08/09/2022 negative Family history-  Colon cancer: no  Breast cancer: no  Ovarian cancer: no Menses: postmenopausal Vaccines-   Flu: due  Tetanus: UTD  Shingles: UTD  COVID19: x1 HIV screening: through blood donation previously Hep C Screening: UTD Tobacco use: quit age 40 Alcohol use: social 2 drinks at most Illicit Drug use: no Dentist: yes Ophthalmology: yes Possible swollen salivary glands noted on exam.  She notes no discomfort and has not noticed any of these previously.   Active Ambulatory Problems    Diagnosis Date Noted   Encounter for general adult medical examination with abnormal findings 03/28/2016   Class 2 severe obesity due to excess calories with serious comorbidity and body mass index (BMI) of 39.0 to 39.9 in adult (HCC) 04/03/2013   Allergic rhinitis 04/06/2017   HTN (hypertension) 06/08/2017   Prediabetes 01/24/2019   Chronic back pain 12/21/2020   Other fatigue 07/28/2021   SOB (shortness of breath) on exertion 07/28/2021   Vitamin D deficiency 07/28/2021   Health care maintenance 07/28/2021   Eating disorder 07/28/2021   Elevated cholesterol 08/01/2021   Elevated TSH 08/01/2021   obesity related depression screening 11/07/2021   Pre-diabetes 11/10/2021   Urine frequency 08/29/2022   Rash 08/29/2022   Anxiety and depression 08/29/2022   Submandibular gland swelling 09/27/2022   Fire ant bite 09/27/2022   Resolved Ambulatory Problems    Diagnosis Date Noted   UTI (urinary tract infection) 06/28/2015   Rash and nonspecific skin eruption 06/28/2015   Skin lesion on examination  03/28/2016   Atrophic vaginitis 04/03/2013   Dysuria 06/08/2017   Vaginal discharge 06/08/2017   Postmenopausal bleeding 06/08/2017   Hematuria 09/28/2017   Stress 09/28/2017   Right thigh pain 01/12/2018   Past Medical History:  Diagnosis Date   Back pain    Chickenpox    Heart burn    High blood pressure    History of recurrent UTIs     Family History  Problem Relation Age of Onset   Brain cancer Mother    Heart disease Mother    Hyperlipidemia Mother    Hypertension Mother    Diabetes Mother    Depression Mother    Cirrhosis Father    Alcoholism Father    Thyroid disease Father    Depression Father    Liver disease Father    Diabetes Sister    Hypertension Sister    Sleep apnea Sister    Breast cancer Neg Hx    Ovarian cancer Neg Hx    Colon cancer Neg Hx     Social History   Socioeconomic History   Marital status: Married    Spouse name: Not on file   Number of children: Not on file   Years of education: Not on file   Highest education level: Associate degree: occupational, Scientist, product/process development, or vocational program  Occupational History   Occupation: Educational psychologist   Tobacco Use   Smoking status: Former   Smokeless tobacco: Never  Vaping Use   Vaping status: Never Used  Substance and Sexual Activity   Alcohol use: No    Alcohol/week: 0.0 standard  drinks of alcohol   Drug use: No   Sexual activity: Not Currently  Other Topics Concern   Not on file  Social History Narrative   Not on file   Social Determinants of Health   Financial Resource Strain: Low Risk  (08/28/2022)   Overall Financial Resource Strain (CARDIA)    Difficulty of Paying Living Expenses: Not hard at all  Food Insecurity: No Food Insecurity (08/28/2022)   Hunger Vital Sign    Worried About Running Out of Food in the Last Year: Never true    Ran Out of Food in the Last Year: Never true  Transportation Needs: No Transportation Needs (08/28/2022)   PRAPARE - Scientist, research (physical sciences) (Medical): No    Lack of Transportation (Non-Medical): No  Physical Activity: Insufficiently Active (08/28/2022)   Exercise Vital Sign    Days of Exercise per Week: 3 days    Minutes of Exercise per Session: 30 min  Stress: Stress Concern Present (08/28/2022)   [employer] of Occupational Health - Occupational Stress Questionnaire    Feeling of Stress : Very much  Social Connections: Socially Integrated (08/28/2022)   Social Connection and Isolation Panel [NHANES]    Frequency of Communication with Friends and Family: Three times a week    Frequency of Social Gatherings with Friends and Family: Once a week    Attends Religious Services: More than 4 times per year    Active Member of [employer] or Organizations: Yes    Attends Banker Meetings: 1 to 4 times per year    Marital Status: Married  Catering manager Violence: Not on file    ROS  General:  Negative for nexplained weight loss, fever Skin: Negative for new or changing mole, sore that won't heal HEENT: Negative for trouble hearing, trouble seeing, ringing in ears, mouth sores, hoarseness, change in voice, dysphagia. CV:  Negative for chest pain, dyspnea, edema, palpitations Resp: Negative for cough, dyspnea, hemoptysis GI: Negative for nausea, vomiting, diarrhea, constipation, abdominal pain, melena, hematochezia. GU: Negative for dysuria, incontinence, urinary hesitance, hematuria, vaginal or penile discharge, polyuria, sexual difficulty, lumps in testicle or breasts MSK: Negative for muscle cramps or aches, joint pain or swelling Neuro: Negative for headaches, weakness, numbness, dizziness, passing out/fainting Psych: Negative for depression, anxiety, memory problems  Objective  Physical Exam Vitals:   09/27/22 0852  BP: 116/74  Pulse: 62  Temp: 98.4 F (36.9 C)  SpO2: 97%    BP Readings from Last 3 Encounters:  09/27/22 116/74  08/29/22 130/82  03/27/22 122/82   Wt Readings from  Last 3 Encounters:  09/27/22 256 lb 3.2 oz (116.2 kg)  08/29/22 257 lb 6.4 oz (116.8 kg)  03/27/22 254 lb (115.2 kg)    Physical Exam Constitutional:      General: She is not in acute distress.    Appearance: She is not diaphoretic.  HENT:     Head: Normocephalic and atraumatic.  Neck:     Comments: Possible submandibular salivary gland enlargement bilaterally, nontender Cardiovascular:     Rate and Rhythm: Normal rate and regular rhythm.     Heart sounds: Normal heart sounds.  Pulmonary:     Effort: Pulmonary effort is normal.     Breath sounds: Normal breath sounds.  Abdominal:     General: Bowel sounds are normal. There is no distension.     Palpations: Abdomen is soft.     Tenderness: There is no abdominal tenderness.  Musculoskeletal:  Right lower leg: No edema.     Left lower leg: No edema.  Lymphadenopathy:     Cervical: No cervical adenopathy.  Skin:    General: Skin is warm and dry.     Comments: Scattered fire ant bites on bilateral lower extremities  Neurological:     Mental Status: She is alert.  Psychiatric:        Mood and Affect: Mood normal.      Assessment/Plan:   Encounter for general adult medical examination with abnormal findings Assessment & Plan: Physical exam completed.  Encouraged continued dietary changes.  Encouraged continued exercise and to increase exercise if possible.  GI referral placed for colon cancer screening.  Pap smear and mammogram are up-to-date.  Flu vaccine given today.  Patient declines further COVID vaccinations.  Discussed she could benefit from an additional COVID-vaccine.  Lab work as outlined.   Submandibular gland swelling Assessment & Plan: Possible salivary gland enlargement on exam.  Ultrasound ordered today.  Orders: -     US SOFT TISSUE HEAD & NECK (NON-THYROID); Future  Prediabetes -     Hemoglobin A1c  Elevated cholesterol -     Comprehensive metabolic panel -     Lipid panel  Colon cancer  screening -     Ambulatory referral to Gastroenterology  Encounter for administration of vaccine -     Flu vaccine trivalent PF, 6mos and older(Flulaval,Afluria,Fluarix,Fluzone)  Fire ant bite, accidental or unintentional, initial encounter Assessment & Plan: Bites appear to be healing well.  She will monitor.     Return in about 6 months (around 03/27/2023) for htn.   Marikay Alar, MD [facility]

## 2022-09-27 NOTE — Telephone Encounter (Signed)
Gastroenterology Pre-Procedure Review  Request Date: 11/20/22 Requesting Physician: Dr. Tobi Bastos  PATIENT REVIEW QUESTIONS: The patient responded to the following health history questions as indicated:    1. Are you having any GI issues? no 2. Do you have a personal history of Polyps? no 3. Do you have a family history of Colon Cancer or Polyps? no 4. Diabetes Mellitus? no 5. Joint replacements in the past 12 months?no 6. Major health problems in the past 3 months?no 7. Any artificial heart valves, MVP, or defibrillator?no    MEDICATIONS & ALLERGIES:    Patient reports the following regarding taking any anticoagulation/antiplatelet therapy:   Plavix, Coumadin, Eliquis, Xarelto, Lovenox, Pradaxa, Brilinta, or Effient? no Aspirin? no  Patient confirms/reports the following medications:  Current Outpatient Medications  Medication Sig Dispense Refill   Na Sulfate-K Sulfate-Mg Sulf 17.5-3.13-1.6 GM/177ML SOLN Take 1 kit by mouth once for 1 dose. 354 mL 0   amLODipine (NORVASC) 10 MG tablet TAKE 1 TABLET BY MOUTH EVERY DAY 150 tablet 0   Copper Gluconate (COPPER CAPS PO) Take by mouth.     Cyanocobalamin (VITAMIN B 12 PO) Take by mouth.     fluticasone (FLONASE) 50 MCG/ACT nasal spray Place 2 sprays into both nostrils daily. 16 g 6   loratadine (CLARITIN) 10 MG tablet TAKE 1 TABLET BY MOUTH EVERY DAY 90 tablet 1   Multiple Vitamin (THERA) TABS Take by mouth.     Omega-3 Fatty Acids (FISH OIL) 1000 MG CAPS Take by mouth.     VITAMIN D, CHOLECALCIFEROL, PO Take by mouth.     Zinc Sulfate (ZINC-220 PO) Take by mouth.     No current facility-administered medications for this visit.    Patient confirms/reports the following allergies:  Allergies  Allergen Reactions   Methenamine Rash   Sulfa Antibiotics Rash    No orders of the defined types were placed in this encounter.   AUTHORIZATION INFORMATION Primary Insurance: 1D#: Group #:  Secondary Insurance: 1D#: Group  #:  SCHEDULE INFORMATION: Date: 11/20/22 Time: Location: [facility]

## 2022-09-27 NOTE — Assessment & Plan Note (Signed)
Physical exam completed.  Encouraged continued dietary changes.  Encouraged continued exercise and to increase exercise if possible.  GI referral placed for colon cancer screening.  Pap smear and mammogram are up-to-date.  Flu vaccine given today.  Patient declines further COVID vaccinations.  Discussed she could benefit from an additional COVID-vaccine.  Lab work as outlined.

## 2022-10-03 ENCOUNTER — Ambulatory Visit
Admission: RE | Admit: 2022-10-03 | Discharge: 2022-10-03 | Disposition: A | Discharge status: Home / Self Care | Payer: BC Managed Care – PPO | Source: Ambulatory Visit | Attending: Family Medicine | Admitting: Family Medicine

## 2022-10-03 DIAGNOSIS — R600 Localized edema: Secondary | ICD-10-CM | POA: Insufficient documentation

## 2022-10-03 DIAGNOSIS — K111 Hypertrophy of salivary gland: Secondary | ICD-10-CM | POA: Diagnosis not present

## 2022-11-13 ENCOUNTER — Encounter: Payer: Self-pay | Admitting: Gastroenterology

## 2022-11-20 ENCOUNTER — Encounter
Admission: RE | Disposition: A | Discharge status: Home / Self Care | Payer: Self-pay | Source: Home / Self Care | Attending: Gastroenterology

## 2022-11-20 ENCOUNTER — Ambulatory Visit
Admission: RE | Admit: 2022-11-20 | Discharge: 2022-11-20 | Disposition: A | Discharge status: Home / Self Care | Payer: BC Managed Care – PPO | Attending: Gastroenterology | Admitting: Gastroenterology

## 2022-11-20 ENCOUNTER — Encounter: Payer: Self-pay | Admitting: Gastroenterology

## 2022-11-20 ENCOUNTER — Ambulatory Visit: Payer: BC Managed Care – PPO | Admitting: Registered Nurse

## 2022-11-20 DIAGNOSIS — K635 Polyp of colon: Secondary | ICD-10-CM | POA: Diagnosis not present

## 2022-11-20 DIAGNOSIS — Z87891 Personal history of nicotine dependence: Secondary | ICD-10-CM | POA: Insufficient documentation

## 2022-11-20 DIAGNOSIS — Z1211 Encounter for screening for malignant neoplasm of colon: Secondary | ICD-10-CM

## 2022-11-20 DIAGNOSIS — D122 Benign neoplasm of ascending colon: Secondary | ICD-10-CM | POA: Insufficient documentation

## 2022-11-20 DIAGNOSIS — K5730 Diverticulosis of large intestine without perforation or abscess without bleeding: Secondary | ICD-10-CM | POA: Diagnosis not present

## 2022-11-20 DIAGNOSIS — D126 Benign neoplasm of colon, unspecified: Secondary | ICD-10-CM

## 2022-11-20 DIAGNOSIS — I10 Essential (primary) hypertension: Secondary | ICD-10-CM | POA: Insufficient documentation

## 2022-11-20 DIAGNOSIS — K219 Gastro-esophageal reflux disease without esophagitis: Secondary | ICD-10-CM | POA: Diagnosis not present

## 2022-11-20 HISTORY — PX: COLONOSCOPY WITH PROPOFOL: SHX5780

## 2022-11-20 HISTORY — PX: POLYPECTOMY: SHX5525

## 2022-11-20 SURGERY — COLONOSCOPY WITH PROPOFOL
Anesthesia: Monitor Anesthesia Care

## 2022-11-20 MED ORDER — PROPOFOL 10 MG/ML IV BOLUS
INTRAVENOUS | Status: AC
  Filled 2022-11-20: qty 20

## 2022-11-20 MED ORDER — PROPOFOL 500 MG/50ML IV EMUL
INTRAVENOUS | Status: DC | PRN
  Administered 2022-11-20: 100 ug/kg/min via INTRAVENOUS

## 2022-11-20 MED ORDER — SODIUM CHLORIDE 0.9 % IV SOLN: INTRAVENOUS | Status: DC

## 2022-11-20 MED ORDER — LIDOCAINE HCL (CARDIAC) PF 100 MG/5ML IV SOSY
PREFILLED_SYRINGE | INTRAVENOUS | Status: DC | PRN
  Administered 2022-11-20: 50 mg via INTRAVENOUS

## 2022-11-20 MED ORDER — PROPOFOL 10 MG/ML IV BOLUS
INTRAVENOUS | Status: DC | PRN
  Administered 2022-11-20: 100 mg via INTRAVENOUS

## 2022-11-20 NOTE — Anesthesia Procedure Notes (Signed)
Procedure Name: MAC Date/Time: 11/20/2022 8:11 AM  Performed by: Lily Lovings, CRNAPre-anesthesia Checklist: Patient identified, Emergency Drugs available, Suction available, Patient being monitored and Timeout performed Patient Re-evaluated:Patient Re-evaluated prior to induction Oxygen Delivery Method: Simple face mask Preoxygenation: Pre-oxygenation with 100% oxygen Comments: POM

## 2022-11-20 NOTE — Op Note (Signed)
[facility] Patient Name: Anne Ryan Procedure Date: 11/20/2022 8:12 AM MRN: [id number] Account #: [id number] Date of Birth: 1960/12/15 Admit Type: Outpatient Age: 62 Room: [facility] ENDO ROOM 1 Gender: Female Note Status: Finalized Instrument Name: Nelda Marseille [id number] Procedure:             Colonoscopy Indications:           Screening for colorectal malignant neoplasm Providers:             Wyline Mood MD, MD Referring MD:          Yehuda Mao. Birdie Sons (Referring MD) Medicines:             Monitored Anesthesia Care Complications:         No immediate complications. Procedure:             Pre-Anesthesia Assessment:                        - Prior to the procedure, a History and Physical was                         performed, and patient medications, allergies and                         sensitivities were reviewed. The patient's tolerance                         of previous anesthesia was reviewed.                        - The risks and benefits of the procedure and the                         sedation options and risks were discussed with the                         patient. All questions were answered and informed                         consent was obtained.                        - ASA Grade Assessment: II - A patient with mild                         systemic disease.                        After obtaining informed consent, the colonoscope was                         passed under direct vision. Throughout the procedure,                         the patient's blood pressure, pulse, and oxygen                         saturations were monitored continuously. The                         Colonoscope was introduced  through the anus and                         advanced to the the cecum, identified by the                         appendiceal orifice. The colonoscopy was performed                         with ease. The patient tolerated the procedure well.                          The quality of the bowel preparation was excellent.                         The ileocecal valve, appendiceal orifice, and rectum                         were photographed. Findings:      The perianal and digital rectal examinations were normal.      Two sessile polyps were found in the ascending colon. The polyps were 4       to 5 mm in size. These polyps were removed with a cold snare. Resection       and retrieval were complete.      Multiple medium-mouthed diverticula were found in the sigmoid colon.      The exam was otherwise without abnormality on direct and retroflexion       views. Impression:            - Two 4 to 5 mm polyps in the ascending colon, removed                         with a cold snare. Resected and retrieved.                        - Diverticulosis in the sigmoid colon.                        - The examination was otherwise normal on direct and                         retroflexion views. Recommendation:        - Discharge patient to home (with escort).                        - Resume previous diet.                        - Continue present medications.                        - Await pathology results.                        - Repeat colonoscopy for surveillance based on                         pathology results. Procedure Code(s):     --- Professional ---  08657, Colonoscopy, flexible; with removal of                         tumor(s), polyp(s), or other lesion(s) by snare                         technique Diagnosis Code(s):     --- Professional ---                        Z12.11, Encounter for screening for malignant neoplasm                         of colon                        D12.2, Benign neoplasm of ascending colon                        K57.30, Diverticulosis of large intestine without                         perforation or abscess without bleeding CPT copyright 2022 American Medical Association. All rights  reserved. The codes documented in this report are preliminary and upon coder review may  be revised to meet current compliance requirements. Wyline Mood, MD Wyline Mood MD, MD 11/20/2022 8:36:18 AM This report has been signed electronically. Number of Addenda: 0 Note Initiated On: 11/20/2022 8:12 AM Scope Withdrawal Time: 0 hours 12 minutes 49 seconds  Total Procedure Duration: 0 hours 16 minutes 23 seconds  Estimated Blood Loss:  Estimated blood loss: none.      [facility]

## 2022-11-20 NOTE — H&P (Signed)
Wyline Mood, MD [address], [place], [place], [address] [address], [address], [place], [place], [address] Phone: [phone]  Fax: [phone]  Primary Care Physician:  Glori Luis, MD   Pre-Procedure History & Physical: HPI:  Anne Ryan is a 62 y.o. female is here for an colonoscopy.   Past Medical History:  Diagnosis Date   Back pain    Chickenpox    Heart burn    High blood pressure    History of recurrent UTIs     Past Surgical History:  Procedure Laterality Date   BLADDER SURGERY  1968   CARPAL TUNNEL RELEASE     TONSILLECTOMY  1986   TRANSVAGINAL TAPE (TVT) REMOVAL      Prior to Admission medications   Medication Sig Start Date End Date Taking? Authorizing Provider  amLODipine (NORVASC) 10 MG tablet TAKE 1 TABLET BY MOUTH EVERY DAY 08/07/22  Yes Glori Luis, MD  Copper Gluconate (COPPER CAPS PO) Take by mouth.    [provider]  Cyanocobalamin (VITAMIN B 12 PO) Take by mouth.    [provider]  fluticasone (FLONASE) 50 MCG/ACT nasal spray Place 2 sprays into both nostrils daily. 04/06/17   Glori Luis, MD  loratadine (CLARITIN) 10 MG tablet TAKE 1 TABLET BY MOUTH EVERY DAY 12/16/21   Glori Luis, MD  Multiple Vitamin (THERA) TABS Take by mouth.    [provider]  Omega-3 Fatty Acids (FISH OIL) 1000 MG CAPS Take by mouth.    [provider]  VITAMIN D, CHOLECALCIFEROL, PO Take by mouth.    [provider]  Zinc Sulfate (ZINC-220 PO) Take by mouth.    [provider]    Allergies as of 09/27/2022 - Review Complete 09/27/2022  Allergen Reaction Noted   Methenamine Rash 06/28/2015   Sulfa antibiotics Rash 06/28/2015    Family History  Problem Relation Age of Onset   Brain cancer Mother    Heart disease Mother    Hyperlipidemia Mother    Hypertension Mother    Diabetes Mother    Depression Mother    Cirrhosis Father    Alcoholism Father    Thyroid  disease Father    Depression Father    Liver disease Father    Diabetes Sister    Hypertension Sister    Sleep apnea Sister    Breast cancer Neg Hx    Ovarian cancer Neg Hx    Colon cancer Neg Hx     Social History   Socioeconomic History   Marital status: Married    Spouse name: Not on file   Number of children: Not on file   Years of education: Not on file   Highest education level: Associate degree: occupational, Scientist, product/process development, or vocational program  Occupational History   Occupation: Educational psychologist   Tobacco Use   Smoking status: Former   Smokeless tobacco: Never  Advertising account planner   Vaping status: Never Used  Substance and Sexual Activity   Alcohol use: No    Comment: occas   Drug use: No   Sexual activity: Not Currently  Other Topics Concern   Not on file  Social History Narrative   Not on file   Social Determinants of Health   Financial Resource Strain: Low Risk  (08/28/2022)   Overall Financial Resource Strain (CARDIA)    Difficulty of Paying Living Expenses: Not hard at all  Food Insecurity: No Food Insecurity (08/28/2022)   Hunger  Vital Sign    Worried About Programme researcher, broadcasting/film/video in the Last Year: Never true    Ran Out of Food in the Last Year: Never true  Transportation Needs: No Transportation Needs (08/28/2022)   PRAPARE - Administrator, Civil Service (Medical): No    Lack of Transportation (Non-Medical): No  Physical Activity: Insufficiently Active (08/28/2022)   Exercise Vital Sign    Days of Exercise per Week: 3 days    Minutes of Exercise per Session: 30 min  Stress: Stress Concern Present (08/28/2022)   [employer] of Occupational Health - Occupational Stress Questionnaire    Feeling of Stress : Very much  Social Connections: Socially Integrated (08/28/2022)   Social Connection and Isolation Panel [NHANES]    Frequency of Communication with Friends and Family: Three times a week    Frequency of Social Gatherings with Friends and Family:  Once a week    Attends Religious Services: More than 4 times per year    Active Member of [employer] or Organizations: Yes    Attends Banker Meetings: 1 to 4 times per year    Marital Status: Married  Catering manager Violence: Not on file    Review of Systems: See HPI, otherwise negative ROS  Physical Exam: BP 129/81   Pulse 63   Temp (!) 97.3 F (36.3 C) (Temporal)   Resp 18   Ht 5\' 7"  (1.702 m)   Wt 112 kg   SpO2 98%   BMI 38.69 kg/m  General:   Alert,  pleasant and cooperative in NAD Head:  Normocephalic and atraumatic. Neck:  Supple; no masses or thyromegaly. Lungs:  Clear throughout to auscultation, normal respiratory effort.    Heart:  +S1, +S2, Regular rate and rhythm, No edema. Abdomen:  Soft, nontender and nondistended. Normal bowel sounds, without guarding, and without rebound.   Neurologic:  Alert and  oriented x4;  grossly normal neurologically.  Impression/Plan: Anne Ryan is here for an colonoscopy to be performed for Screening colonoscopy average risk   Risks, benefits, limitations, and alternatives regarding  colonoscopy have been reviewed with the patient.  Questions have been answered.  All parties agreeable.   Wyline Mood, MD  11/20/2022, 8:11 AM

## 2022-11-20 NOTE — Anesthesia Postprocedure Evaluation (Signed)
Anesthesia Post Note  Patient: Christine Panzarella  Procedure(s) Performed: COLONOSCOPY WITH PROPOFOL POLYPECTOMY  Patient location during evaluation: PACU Anesthesia Type: General Level of consciousness: awake and alert Pain management: pain level controlled Vital Signs Assessment: post-procedure vital signs reviewed and stable Respiratory status: spontaneous breathing, nonlabored ventilation, respiratory function stable and patient connected to nasal cannula oxygen Cardiovascular status: blood pressure returned to baseline and stable Postop Assessment: no apparent nausea or vomiting Anesthetic complications: no  No notable events documented.   Last Vitals:  Vitals:   11/20/22 0840 11/20/22 0846  BP: 106/65 127/79  Pulse: 69   Resp: 16   Temp:    SpO2: 98%     Last Pain:  Vitals:   11/20/22 0846  TempSrc:   PainSc: 0-No pain                 Stephanie Coup

## 2022-11-20 NOTE — Transfer of Care (Signed)
Immediate Anesthesia Transfer of Care Note  Patient: Anne Ryan  Procedure(s) Performed: COLONOSCOPY WITH PROPOFOL POLYPECTOMY  Patient Location: Endoscopy Unit  Anesthesia Type:MAC  Level of Consciousness: drowsy and patient cooperative  Airway & Oxygen Therapy: Patient Spontanous Breathing and Patient connected to face mask oxygen  Post-op Assessment: Report given to RN and Patient moving all extremities  Post vital signs: Reviewed and stable  Last Vitals:  Vitals Value Taken Time  BP 106/65 11/20/22 0840  Temp 36.1 C 11/20/22 0836  Pulse 72 11/20/22 0841  Resp 17 11/20/22 0841  SpO2 96 % 11/20/22 0841  Vitals shown include unfiled device data.  Last Pain:  Vitals:   11/20/22 0840  TempSrc:   PainSc: 0-No pain         Complications: No notable events documented.

## 2022-11-20 NOTE — Anesthesia Preprocedure Evaluation (Signed)
Anesthesia Evaluation  Patient identified by MRN, date of birth, ID band Patient awake    Reviewed: Allergy & Precautions, NPO status , Patient's Chart, lab work & pertinent test results  Airway Mallampati: III  TM Distance: >3 FB Neck ROM: full    Dental  (+) Dental Advidsory Given   Pulmonary neg pulmonary ROS, former smoker   Pulmonary exam normal        Cardiovascular hypertension, On Medications negative cardio ROS Normal cardiovascular exam     Neuro/Psych negative neurological ROS  negative psych ROS   GI/Hepatic Neg liver ROS,GERD  Controlled and Medicated,,  Endo/Other  negative endocrine ROS    Renal/GU negative Renal ROS  negative genitourinary   Musculoskeletal   Abdominal   Peds  Hematology negative hematology ROS (+)   Anesthesia Other Findings Past Medical History: No date: Back pain No date: Chickenpox No date: Heart burn No date: High blood pressure No date: History of recurrent UTIs  Past Surgical History: 1968: BLADDER SURGERY No date: CARPAL TUNNEL RELEASE 1986: TONSILLECTOMY No date: TRANSVAGINAL TAPE (TVT) REMOVAL  BMI    Body Mass Index: 38.69 kg/m      Reproductive/Obstetrics negative OB ROS                             Anesthesia Physical Anesthesia Plan  ASA: 2  Anesthesia Plan: General   Post-op Pain Management:    Induction: Intravenous  PONV Risk Score and Plan: Ondansetron, Propofol infusion and TIVA  Airway Management Planned: Natural Airway and Nasal Cannula  Additional Equipment:   Intra-op Plan:   Post-operative Plan:   Informed Consent: I have reviewed the patients History and Physical, chart, labs and discussed the procedure including the risks, benefits and alternatives for the proposed anesthesia with the patient or authorized representative who has indicated his/her understanding and acceptance.     Dental Advisory  Given  Plan Discussed with: Anesthesiologist, CRNA and Surgeon  Anesthesia Plan Comments: (Patient consented for risks of anesthesia including but not limited to:  - adverse reactions to medications - risk of airway placement if required - damage to eyes, teeth, lips or other oral mucosa - nerve damage due to positioning  - sore throat or hoarseness - Damage to heart, brain, nerves, lungs, other parts of body or loss of life  Patient voiced understanding and assent.)       Anesthesia Quick Evaluation

## 2022-11-21 ENCOUNTER — Encounter: Payer: Self-pay | Admitting: Gastroenterology

## 2022-11-21 LAB — SURGICAL PATHOLOGY

## 2022-12-31 ENCOUNTER — Other Ambulatory Visit: Payer: Self-pay | Admitting: Family Medicine

## 2023-03-21 ENCOUNTER — Telehealth: Payer: Self-pay | Admitting: Family Medicine

## 2023-03-21 NOTE — Telephone Encounter (Signed)
 Dr Clent Ridges is leaving the practice and your Transfer of Care needs to be rescheduled with another provider. Please call the office to schedule a Transfer of Care to either Dr Charlann Lange, Darleen Crocker or Kara Dies, NP.   Thank you

## 2023-03-28 ENCOUNTER — Encounter: Payer: BC Managed Care – PPO | Admitting: Family Medicine

## 2023-03-28 ENCOUNTER — Ambulatory Visit: Payer: BC Managed Care – PPO | Admitting: Family Medicine

## 2023-04-01 DIAGNOSIS — M1711 Unilateral primary osteoarthritis, right knee: Secondary | ICD-10-CM | POA: Diagnosis not present

## 2023-04-25 DIAGNOSIS — I10 Essential (primary) hypertension: Secondary | ICD-10-CM | POA: Diagnosis not present

## 2023-04-25 DIAGNOSIS — J029 Acute pharyngitis, unspecified: Secondary | ICD-10-CM | POA: Diagnosis not present

## 2023-04-28 DIAGNOSIS — J029 Acute pharyngitis, unspecified: Secondary | ICD-10-CM | POA: Diagnosis not present

## 2023-04-28 DIAGNOSIS — J0100 Acute maxillary sinusitis, unspecified: Secondary | ICD-10-CM | POA: Diagnosis not present

## 2023-04-30 DIAGNOSIS — M1711 Unilateral primary osteoarthritis, right knee: Secondary | ICD-10-CM | POA: Diagnosis not present

## 2023-05-04 ENCOUNTER — Ambulatory Visit (INDEPENDENT_AMBULATORY_CARE_PROVIDER_SITE_OTHER): Admitting: Physician Assistant

## 2023-05-04 VITALS — BP 109/64 | HR 71 | Resp 16 | Ht 67.0 in | Wt 248.1 lb

## 2023-05-04 DIAGNOSIS — E66812 Obesity, class 2: Secondary | ICD-10-CM | POA: Diagnosis not present

## 2023-05-04 DIAGNOSIS — K219 Gastro-esophageal reflux disease without esophagitis: Secondary | ICD-10-CM

## 2023-05-04 DIAGNOSIS — R5383 Other fatigue: Secondary | ICD-10-CM

## 2023-05-04 DIAGNOSIS — I10 Essential (primary) hypertension: Secondary | ICD-10-CM

## 2023-05-04 DIAGNOSIS — R7303 Prediabetes: Secondary | ICD-10-CM | POA: Diagnosis not present

## 2023-05-04 DIAGNOSIS — Z6839 Body mass index (BMI) 39.0-39.9, adult: Secondary | ICD-10-CM

## 2023-05-04 DIAGNOSIS — E7800 Pure hypercholesterolemia, unspecified: Secondary | ICD-10-CM

## 2023-05-04 DIAGNOSIS — J302 Other seasonal allergic rhinitis: Secondary | ICD-10-CM

## 2023-05-04 DIAGNOSIS — R748 Abnormal levels of other serum enzymes: Secondary | ICD-10-CM

## 2023-05-04 DIAGNOSIS — E559 Vitamin D deficiency, unspecified: Secondary | ICD-10-CM

## 2023-05-04 DIAGNOSIS — R7989 Other specified abnormal findings of blood chemistry: Secondary | ICD-10-CM

## 2023-05-04 NOTE — Progress Notes (Signed)
 New patient visit  Patient: Anne Ryan   DOB: 1960/07/12   63 y.o. Female  MRN: [id number] Visit Date: 05/04/2023  Today's healthcare provider: Blane Bunting, PA-C   Chief Complaint  Patient presents with   New Patient (Initial Visit)    NP/ Est Care no other concerns   Subjective    Anne Ryan is a 63 y.o. female who presents today as a new patient to establish care.   Discussed the use of AI scribe software for clinical note transcription with the patient, who gave verbal consent to proceed.  History of Present Illness The patient, previously under the care of Dr. Lovetta Rucks, is establishing care due to the departure of her previous providers. She has a history of high blood pressure, heartburn, fatigue, vitamin D  deficiency, prediabetes, elevated cholesterol, and elevated TSH. She reports recent weight loss due to decreased appetite from a recent strep throat infection, diagnosed last Saturday. She has been on antibiotics since then and is currently recovering. She also reports arthritis in the knee, which has limited her ability to walk. She is scheduled to receive a gel injection in the knee and has been given home physical therapy exercises. She also reports a history of back injury two years ago.    Past Medical History:  Diagnosis Date   Back pain    Chickenpox    Heart burn    High blood pressure    History of recurrent UTIs    Past Surgical History:  Procedure Laterality Date   BLADDER SURGERY  1968   CARPAL TUNNEL RELEASE     COLONOSCOPY WITH PROPOFOL  N/A 11/20/2022   Procedure: COLONOSCOPY WITH PROPOFOL ;  Surgeon: Luke Salaam, MD;  Location: [facility] ENDOSCOPY;  Service: Gastroenterology;  Laterality: N/A;   POLYPECTOMY  11/20/2022   Procedure: POLYPECTOMY;  Surgeon: Luke Salaam, MD;  Location: [facility] ENDOSCOPY;  Service: Gastroenterology;;   TONSILLECTOMY  1986   TRANSVAGINAL TAPE (TVT) REMOVAL     Family Status  Relation Name Status   Mother  Deceased   Father   Deceased   Sister  Alive   MGM  Deceased   MGF  Deceased   PGM  Deceased   PGF  Deceased   Neg Hx  (Not Specified)  No partnership data on file   Family History  Problem Relation Age of Onset   Brain cancer Mother    Heart disease Mother    Hyperlipidemia Mother    Hypertension Mother    Diabetes Mother    Depression Mother    Cirrhosis Father    Alcoholism Father    Thyroid  disease Father    Depression Father    Liver disease Father    Diabetes Sister    Hypertension Sister    Sleep apnea Sister    Breast cancer Neg Hx    Ovarian cancer Neg Hx    Colon cancer Neg Hx    Social History   Socioeconomic History   Marital status: Married    Spouse name: Not on file   Number of children: Not on file   Years of education: Not on file   Highest education level: Associate degree: occupational, Scientist, product/process development, or vocational program  Occupational History   Occupation: Educational psychologist   Tobacco Use   Smoking status: Former   Smokeless tobacco: Never  Advertising account planner   Vaping status: Never Used  Substance and Sexual Activity   Alcohol use: No    Comment: occas   Drug use: No   Sexual  activity: Not Currently  Other Topics Concern   Not on file  Social History Narrative   Not on file   Social Drivers of Health   Financial Resource Strain: Low Risk  (04/30/2023)   Overall Financial Resource Strain (CARDIA)    Difficulty of Paying Living Expenses: Not hard at all  Food Insecurity: No Food Insecurity (04/30/2023)   Hunger Vital Sign    Worried About Running Out of Food in the Last Year: Never true    Ran Out of Food in the Last Year: Never true  Transportation Needs: No Transportation Needs (04/30/2023)   PRAPARE - Administrator, Civil Service (Medical): No    Lack of Transportation (Non-Medical): No  Physical Activity: Insufficiently Active (04/30/2023)   Exercise Vital Sign    Days of Exercise per Week: 2 days    Minutes of Exercise per Session: 30 min  Stress:  No Stress Concern Present (04/30/2023)   [employer] of Occupational Health - Occupational Stress Questionnaire    Feeling of Stress : Not at all  Social Connections: Socially Integrated (04/30/2023)   Social Connection and Isolation Panel [NHANES]    Frequency of Communication with Friends and Family: More than three times a week    Frequency of Social Gatherings with Friends and Family: Once a week    Attends Religious Services: More than 4 times per year    Active Member of [employer] or Organizations: Yes    Attends Engineer, structural: More than 4 times per year    Marital Status: Married   Outpatient Medications Prior to Visit  Medication Sig   amLODipine  (NORVASC ) 10 MG tablet TAKE 1 TABLET BY MOUTH EVERY DAY   amoxicillin-clavulanate (AUGMENTIN) 875-125 MG tablet Take 1 tablet by mouth 2 (two) times daily.   Copper Gluconate (COPPER CAPS PO) Take by mouth.   Cyanocobalamin (VITAMIN B 12 PO) Take by mouth.   fluticasone  (FLONASE ) 50 MCG/ACT nasal spray Place 2 sprays into both nostrils daily.   loratadine  (CLARITIN ) 10 MG tablet TAKE 1 TABLET BY MOUTH EVERY DAY   meloxicam (MOBIC) 15 MG tablet Take 15 mg by mouth daily.   Multiple Vitamin (THERA) TABS Take by mouth.   Omega-3 Fatty Acids (FISH OIL) 1000 MG CAPS Take by mouth.   VITAMIN D , CHOLECALCIFEROL, PO Take by mouth.   Zinc Sulfate (ZINC-220 PO) Take by mouth.   No facility-administered medications prior to visit.   Allergies  Allergen Reactions   Methenamine Rash   Sulfa Antibiotics Rash    Immunization History  Administered Date(s) Administered   Influenza Nasal 09/29/2015, 10/24/2016   Influenza Split 10/13/2013, 10/05/2014   Influenza, Seasonal, Injecte, Preservative Fre 09/27/2022   Influenza,inj,Quad PF,6+ Mos 09/29/2015, 10/24/2016, 09/28/2017, 10/20/2018, 10/26/2019, 11/14/2020, 09/26/2021   Influenza,trivalent, recombinat, inj, PF 10/13/2013   Influenza-Unspecified 10/26/2019   Janssen (J&J)  SARS-COV-2 Vaccination 10/02/2019   Td 09/26/2021   Tdap 02/10/2011   Zoster Recombinant(Shingrix ) 04/06/2017, 06/08/2017    Health Maintenance  Topic Date Due   COVID-19 Vaccine (2 - 2024-25 season) 09/10/2022   MAMMOGRAM  08/09/2023   INFLUENZA VACCINE  08/10/2023   Cervical Cancer Screening (HPV/Pap Cotest)  09/27/2026   Colonoscopy  11/19/2029   DTaP/Tdap/Td (3 - Td or Tdap) 09/27/2031   Hepatitis C Screening  Completed   Zoster Vaccines- Shingrix   Completed   HPV VACCINES  Aged Out   Meningococcal B Vaccine  Aged Out   HIV Screening  Discontinued    Patient Care  Team: Yarel Rushlow, PA-C as PCP - General (Physician Assistant)  Review of Systems  All other systems reviewed and are negative.  Except see HPI       Objective    BP 109/64 (BP Location: Left Arm, Patient Position: Sitting, Cuff Size: Large)   Pulse 71   Resp 16   Ht 5\' 7"  (1.702 m)   Wt 248 lb 1.6 oz (112.5 kg)   SpO2 98%   BMI 38.86 kg/m     Physical Exam Vitals reviewed.  Constitutional:      General: She is not in acute distress.    Appearance: Normal appearance. She is well-developed. She is not diaphoretic.  HENT:     Head: Normocephalic and atraumatic.  Eyes:     General: No scleral icterus.    Conjunctiva/sclera: Conjunctivae normal.  Neck:     Thyroid : No thyromegaly.  Cardiovascular:     Rate and Rhythm: Normal rate and regular rhythm.     Pulses: Normal pulses.     Heart sounds: Normal heart sounds. No murmur heard. Pulmonary:     Effort: Pulmonary effort is normal. No respiratory distress.     Breath sounds: Normal breath sounds. No wheezing, rhonchi or rales.  Musculoskeletal:     Cervical back: Neck supple.     Right lower leg: No edema.     Left lower leg: No edema.  Lymphadenopathy:     Cervical: No cervical adenopathy.  Skin:    General: Skin is warm and dry.     Findings: No rash.  Neurological:     Mental Status: She is alert and oriented to person, place,  and time. Mental status is at baseline.  Psychiatric:        Mood and Affect: Mood normal.        Behavior: Behavior normal.     Depression Screen    05/04/2023    3:08 PM 09/27/2022    8:53 AM 08/29/2022   11:15 AM 03/27/2022    8:07 AM  PHQ 2/9 Scores  PHQ - 2 Score 0 0 1 0  PHQ- 9 Score 0 0 10 2   No results found for any visits on 05/04/23.  Assessment & Plan      Assessment & Plan  Hypertension Chronic, stable  Continue taking amlodipine  10  Continue low-salt diet and daily exercise We will follow-up in  Prediabetes Emphasized monitoring and lifestyle modifications. - Order comprehensive blood work to assess current status of prediabetes.  Hyperlipidemia Discussed connection between hyperlipidemia and prediabetes. - Order comprehensive blood work to assess current lipid profile. The 10-year ASCVD risk score (Arnett DK, et al., 2019) is: 3.5%  Elevated TSH Elevated TSH  Not on thyroid  med - Order comprehensive blood work to assess current thyroid  function. Will follow-up  Vitamin D  deficiency In the past - Order comprehensive blood work to assess current vitamin D  levels. Will follow-up  Knee osteoarthritis Recent flare-up. Emphasized ongoing management with physical therapy and potential gel injections pending approval. - Encourage continuation of home physical therapy exercises. - Consider gel injections for knee pending insurance approval. - Explore online resources for joint exercises.  Gastroesophageal reflux disease (GERD) Chronic Advised Elevate the head of the bed 6-8 inches, avoid recumbency for 3 hours after eating, avoid food as a delayed gastric emptying, weight loss    Fatigue Chronic Recent fatigue could be attributed to recovery from streptococcal pharyngitis.  Seasonal allergies - Avoidance measures discussed. - Use nasal saline rinses before  nose sprays such as with Neilmed Sinus Rinse bottle.  Use distilled water.   - Use Flonase  2  sprays each nostril daily. Aim upward and outward. - Use Azelastine 1-2 sprays each nostril twice daily as needed. Aim upward and outward. Will follow-up  Streptococcal pharyngitis Recent diagnosis confirmed by culture after initial negative rapid test. Treated with amoxicillin or Augmentin. Discussed variability in rapid test accuracy. Continue symptomatic treatment  Wellness Visit Emphasized importance of regular follow-ups and chronic condition monitoring. - Schedule follow-up visit in one month for comprehensive physical examination and chronic condition management.  General Health Maintenance Need for updated lab work due to transition in care. - Order comprehensive blood work including fasting glucose, lipid profile, thyroid  function, and vitamin D  levels. - Advise fasting for 8-10 hours before blood work.  Encounter to establish care Welcomed to our clinic Reviewed past medical hx, social hx, family hx and surgical hx Pt advised to send all vaccination records or screening  Class 2 severe obesity due to excess calories with serious comorbidity and body mass index (BMI) of 39.0 to 39.9 in adult (HCC) (Primary) - Lipid panel - Hemoglobin A1c - Comprehensive metabolic panel with GFR - CBC with Differential/Platelet - TSH  Primary hypertension - Lipid panel - Hemoglobin A1c - Comprehensive metabolic panel with GFR - CBC with Differential/Platelet - TSH  Pre-diabetes - Hemoglobin A1c Elevated cholesterol - Lipid panel  Elevated liver enzymes Associated with obesity, prediabetes, HTN -cmp Will follow-up  Elevated TSH/subclinical hypothyroidism In the past, ordered - TSH Will follow-up   Vitamin D  deficiency In the past, will need to recheck - VITAMIN D  25 Hydroxy (Vit-D Deficiency, Fractures)  Other fatigue - Lipid panel - Hemoglobin A1c - Comprehensive metabolic panel with GFR - CBC with Differential/Platelet - TSH    Return in about 4 weeks (around  06/01/2023) for CPE, chronic disease f/u.    The patient was advised to call back or seek an in-person evaluation if the symptoms worsen or if the condition fails to improve as anticipated.  I discussed the assessment and treatment plan with the patient. The patient was provided an opportunity to ask questions and all were answered. The patient agreed with the plan and demonstrated an understanding of the instructions.  I, Zackory Pudlo, PA-C have reviewed all documentation for this visit. The documentation on  05/04/2023   for the exam, diagnosis, procedures, and orders are all accurate and complete.  Blane Bunting, [facility], MMS [facility] [phone] (phone) [phone] (fax)  [facility]

## 2023-05-07 ENCOUNTER — Encounter: Payer: Self-pay | Admitting: Physician Assistant

## 2023-05-08 DIAGNOSIS — R7989 Other specified abnormal findings of blood chemistry: Secondary | ICD-10-CM | POA: Diagnosis not present

## 2023-05-08 DIAGNOSIS — Z6839 Body mass index (BMI) 39.0-39.9, adult: Secondary | ICD-10-CM | POA: Diagnosis not present

## 2023-05-08 DIAGNOSIS — E559 Vitamin D deficiency, unspecified: Secondary | ICD-10-CM | POA: Diagnosis not present

## 2023-05-08 DIAGNOSIS — E7800 Pure hypercholesterolemia, unspecified: Secondary | ICD-10-CM | POA: Diagnosis not present

## 2023-05-08 DIAGNOSIS — R7303 Prediabetes: Secondary | ICD-10-CM | POA: Diagnosis not present

## 2023-05-08 DIAGNOSIS — I10 Essential (primary) hypertension: Secondary | ICD-10-CM | POA: Diagnosis not present

## 2023-05-09 LAB — CBC WITH DIFFERENTIAL/PLATELET
Basophils Absolute: 0 10*3/uL (ref 0.0–0.2)
Basos: 1 %
EOS (ABSOLUTE): 0.1 10*3/uL (ref 0.0–0.4)
Eos: 1 %
Hematocrit: 44.9 % (ref 34.0–46.6)
Hemoglobin: 14.9 g/dL (ref 11.1–15.9)
Immature Grans (Abs): 0 10*3/uL (ref 0.0–0.1)
Immature Granulocytes: 0 %
Lymphocytes Absolute: 2.2 10*3/uL (ref 0.7–3.1)
Lymphs: 29 %
MCH: 29.7 pg (ref 26.6–33.0)
MCHC: 33.2 g/dL (ref 31.5–35.7)
MCV: 89 fL (ref 79–97)
Monocytes Absolute: 0.6 10*3/uL (ref 0.1–0.9)
Monocytes: 8 %
Neutrophils Absolute: 4.7 10*3/uL (ref 1.4–7.0)
Neutrophils: 61 %
Platelets: 371 10*3/uL (ref 150–450)
RBC: 5.02 x10E6/uL (ref 3.77–5.28)
RDW: 12.8 % (ref 11.7–15.4)
WBC: 7.7 10*3/uL (ref 3.4–10.8)

## 2023-05-09 LAB — COMPREHENSIVE METABOLIC PANEL WITH GFR
ALT: 37 IU/L — ABNORMAL HIGH (ref 0–32)
AST: 22 IU/L (ref 0–40)
Albumin: 4.3 g/dL (ref 3.9–4.9)
Alkaline Phosphatase: 107 IU/L (ref 44–121)
BUN/Creatinine Ratio: 31 — ABNORMAL HIGH (ref 12–28)
BUN: 26 mg/dL (ref 8–27)
Bilirubin Total: 0.5 mg/dL (ref 0.0–1.2)
CO2: 24 mmol/L (ref 20–29)
Calcium: 9.7 mg/dL (ref 8.7–10.3)
Chloride: 104 mmol/L (ref 96–106)
Creatinine, Ser: 0.85 mg/dL (ref 0.57–1.00)
Globulin, Total: 3.1 g/dL (ref 1.5–4.5)
Glucose: 108 mg/dL — ABNORMAL HIGH (ref 70–99)
Potassium: 4.4 mmol/L (ref 3.5–5.2)
Sodium: 141 mmol/L (ref 134–144)
Total Protein: 7.4 g/dL (ref 6.0–8.5)
eGFR: 78 mL/min/{1.73_m2} (ref >59)

## 2023-05-09 LAB — VITAMIN D 25 HYDROXY (VIT D DEFICIENCY, FRACTURES): Vit D, 25-Hydroxy: 83.3 ng/mL (ref 30.0–100.0)

## 2023-05-09 LAB — LIPID PANEL
Chol/HDL Ratio: 4 ratio (ref 0.0–4.4)
Cholesterol, Total: 174 mg/dL (ref 100–199)
HDL: 44 mg/dL (ref >39)
LDL Chol Calc (NIH): 115 mg/dL — ABNORMAL HIGH (ref 0–99)
Triglycerides: 81 mg/dL (ref 0–149)
VLDL Cholesterol Cal: 15 mg/dL (ref 5–40)

## 2023-05-09 LAB — HEMOGLOBIN A1C
Est. average glucose Bld gHb Est-mCnc: 123 mg/dL
Hgb A1c MFr Bld: 5.9 % — ABNORMAL HIGH (ref 4.8–5.6)

## 2023-05-09 LAB — TSH: TSH: 4.19 u[IU]/mL (ref 0.450–4.500)

## 2023-05-10 ENCOUNTER — Encounter: Payer: Self-pay | Admitting: Physician Assistant

## 2023-05-11 NOTE — Telephone Encounter (Signed)
 Please see the pt response below

## 2023-06-13 DIAGNOSIS — M25661 Stiffness of right knee, not elsewhere classified: Secondary | ICD-10-CM | POA: Diagnosis not present

## 2023-06-13 DIAGNOSIS — M25561 Pain in right knee: Secondary | ICD-10-CM | POA: Diagnosis not present

## 2023-06-14 ENCOUNTER — Encounter: Payer: Self-pay | Admitting: Physician Assistant

## 2023-06-14 ENCOUNTER — Ambulatory Visit: Admitting: Physician Assistant

## 2023-06-14 VITALS — BP 128/72 | HR 71 | Resp 16 | Ht 67.0 in | Wt 248.0 lb

## 2023-06-14 DIAGNOSIS — Z0001 Encounter for general adult medical examination with abnormal findings: Secondary | ICD-10-CM | POA: Diagnosis not present

## 2023-06-14 DIAGNOSIS — E7800 Pure hypercholesterolemia, unspecified: Secondary | ICD-10-CM

## 2023-06-14 DIAGNOSIS — R7303 Prediabetes: Secondary | ICD-10-CM

## 2023-06-14 DIAGNOSIS — I10 Essential (primary) hypertension: Secondary | ICD-10-CM | POA: Diagnosis not present

## 2023-06-14 DIAGNOSIS — Z0000 Encounter for general adult medical examination without abnormal findings: Secondary | ICD-10-CM

## 2023-06-14 DIAGNOSIS — Z6839 Body mass index (BMI) 39.0-39.9, adult: Secondary | ICD-10-CM

## 2023-06-14 DIAGNOSIS — E66812 Obesity, class 2: Secondary | ICD-10-CM

## 2023-06-14 NOTE — Progress Notes (Signed)
 Complete physical exam  Patient: Anne Ryan   DOB: 04/14/1960   63 y.o. Female  MRN: [id number] Visit Date: 06/14/2023  Today's healthcare provider: Blane Bunting, PA-C   Chief Complaint  Patient presents with   Annual Exam    CPE No other cocnerns   Subjective    Anne Ryan is a 63 y.o. female who presents today for a complete physical exam.   Discussed the use of AI scribe software for clinical note transcription with the patient, who gave verbal consent to proceed.  History of Present Illness Anne Ryan is a 63 year old female who presents for a routine follow-up.  She manages prediabetes and hyperlipidemia through diet and exercise, focusing on lean meats and vegetables. She is uncertain about her current cholesterol and prediabetes status and prefers lifestyle changes over medications. She recently began physical therapy for knee pain and plans to increase physical activity, considering gym or group activities during winter. She reports no issues with rest and sleep patterns. She recently received new glasses from an ophthalmologist. She takes allergy medication prescribed by her previous physician. She denies chest pain, shortness of breath, heart palpitations, abdominal pain, nausea, vaginal discharge, urinary issues, or blood in stool or urine. She had a thyroid  ultrasound a year ago with no current thyroid  concerns.    Last depression screening scores    06/14/2023    3:40 PM 05/04/2023    3:08 PM 09/27/2022    8:53 AM  PHQ 2/9 Scores  PHQ - 2 Score 0 0 0  PHQ- 9 Score 0 0 0   Last fall risk screening    09/27/2022    8:53 AM  Fall Risk   Falls in the past year? 0  Number falls in past yr: 0  Injury with Fall? 0  Risk for fall due to : No Fall Risks  Follow up Falls evaluation completed   Last Audit-C alcohol use screening    04/30/2023   11:14 AM  Alcohol Use Disorder Test (AUDIT)  1. How often do you have a drink containing alcohol? 1  2. How many  drinks containing alcohol do you have on a typical day when you are drinking? 0  3. How often do you have six or more drinks on one occasion? 0  AUDIT-C Score 1      Patient-reported   A score of 3 or more in women, and 4 or more in men indicates increased risk for alcohol abuse, EXCEPT if all of the points are from question 1   Past Medical History:  Diagnosis Date   Back pain    Chickenpox    Heart burn    High blood pressure    History of recurrent UTIs    Past Surgical History:  Procedure Laterality Date   BLADDER SURGERY  1968   CARPAL TUNNEL RELEASE     COLONOSCOPY WITH PROPOFOL  N/A 11/20/2022   Procedure: COLONOSCOPY WITH PROPOFOL ;  Surgeon: Luke Salaam, MD;  Location: [facility] ENDOSCOPY;  Service: Gastroenterology;  Laterality: N/A;   POLYPECTOMY  11/20/2022   Procedure: POLYPECTOMY;  Surgeon: Luke Salaam, MD;  Location: [facility] ENDOSCOPY;  Service: Gastroenterology;;   TONSILLECTOMY  1986   TRANSVAGINAL TAPE (TVT) REMOVAL     Social History   Socioeconomic History   Marital status: Married    Spouse name: Not on file   Number of children: Not on file   Years of education: Not on file   Highest education level: Associate  degree: occupational, Scientist, product/process development, or vocational program  Occupational History   Occupation: Educational psychologist   Tobacco Use   Smoking status: Former   Smokeless tobacco: Never  Advertising account planner   Vaping status: Never Used  Substance and Sexual Activity   Alcohol use: No    Comment: occas   Drug use: No   Sexual activity: Not Currently  Other Topics Concern   Not on file  Social History Narrative   Not on file   Social Drivers of Health   Financial Resource Strain: Low Risk  (04/30/2023)   Overall Financial Resource Strain (CARDIA)    Difficulty of Paying Living Expenses: Not hard at all  Food Insecurity: No Food Insecurity (04/30/2023)   Hunger Vital Sign    Worried About Running Out of Food in the Last Year: Never true    Ran Out of Food in the  Last Year: Never true  Transportation Needs: No Transportation Needs (04/30/2023)   PRAPARE - Administrator, Civil Service (Medical): No    Lack of Transportation (Non-Medical): No  Physical Activity: Insufficiently Active (04/30/2023)   Exercise Vital Sign    Days of Exercise per Week: 2 days    Minutes of Exercise per Session: 30 min  Stress: No Stress Concern Present (04/30/2023)   [employer] of Occupational Health - Occupational Stress Questionnaire    Feeling of Stress : Not at all  Social Connections: Socially Integrated (04/30/2023)   Social Connection and Isolation Panel [NHANES]    Frequency of Communication with Friends and Family: More than three times a week    Frequency of Social Gatherings with Friends and Family: Once a week    Attends Religious Services: More than 4 times per year    Active Member of [employer] or Organizations: Yes    Attends Engineer, structural: More than 4 times per year    Marital Status: Married  Catering manager Violence: Not on file   Family Status  Relation Name Status   Mother  Deceased   Father  Deceased   Sister  Alive   MGM  Deceased   MGF  Deceased   PGM  Deceased   PGF  Deceased   Neg Hx  (Not Specified)  No partnership data on file   Family History  Problem Relation Age of Onset   Brain cancer Mother    Heart disease Mother    Hyperlipidemia Mother    Hypertension Mother    Diabetes Mother    Depression Mother    Cirrhosis Father    Alcoholism Father    Thyroid  disease Father    Depression Father    Liver disease Father    Diabetes Sister    Hypertension Sister    Sleep apnea Sister    Breast cancer Neg Hx    Ovarian cancer Neg Hx    Colon cancer Neg Hx    Allergies  Allergen Reactions   Methenamine Rash   Sulfa Antibiotics Rash    Patient Care Team: Eathen Budreau, PA-C as PCP - General (Physician Assistant)   Medications: Outpatient Medications Prior to Visit  Medication Sig    amLODipine  (NORVASC ) 10 MG tablet TAKE 1 TABLET BY MOUTH EVERY DAY   Copper Gluconate (COPPER CAPS PO) Take by mouth.   Cyanocobalamin (VITAMIN B 12 PO) Take by mouth.   fluticasone  (FLONASE ) 50 MCG/ACT nasal spray Place 2 sprays into both nostrils daily.   loratadine  (CLARITIN ) 10 MG tablet TAKE 1 TABLET BY  MOUTH EVERY DAY   meloxicam (MOBIC) 15 MG tablet Take 15 mg by mouth daily.   Multiple Vitamin (THERA) TABS Take by mouth.   Omega-3 Fatty Acids (FISH OIL) 1000 MG CAPS Take by mouth.   VITAMIN D , CHOLECALCIFEROL, PO Take by mouth.   Zinc Sulfate (ZINC-220 PO) Take by mouth.   No facility-administered medications prior to visit.    Review of Systems  All other systems reviewed and are negative.  Except see HPI     Objective    There were no vitals taken for this visit.     Physical Exam Vitals reviewed.  Constitutional:      General: She is not in acute distress.    Appearance: Normal appearance. She is well-developed. She is not ill-appearing, toxic-appearing or diaphoretic.  HENT:     Head: Normocephalic and atraumatic.     Right Ear: Tympanic membrane, ear canal and external ear normal.     Left Ear: Tympanic membrane, ear canal and external ear normal.     Nose: Nose normal. No congestion or rhinorrhea.     Mouth/Throat:     Mouth: Mucous membranes are moist.     Pharynx: Oropharynx is clear. No oropharyngeal exudate.  Eyes:     General: No scleral icterus.       Right eye: No discharge.        Left eye: No discharge.     Conjunctiva/sclera: Conjunctivae normal.     Pupils: Pupils are equal, round, and reactive to light.  Neck:     Thyroid : No thyromegaly.     Vascular: No carotid bruit.  Cardiovascular:     Rate and Rhythm: Normal rate and regular rhythm.     Pulses: Normal pulses.     Heart sounds: Normal heart sounds. No murmur heard.    No friction rub. No gallop.  Pulmonary:     Effort: Pulmonary effort is normal. No respiratory distress.      Breath sounds: Normal breath sounds. No wheezing or rales.  Abdominal:     General: Abdomen is flat. Bowel sounds are normal. There is no distension.     Palpations: Abdomen is soft. There is no mass.     Tenderness: There is no abdominal tenderness. There is no right CVA tenderness, left CVA tenderness, guarding or rebound.     Hernia: No hernia is present.  Musculoskeletal:        General: No swelling, tenderness, deformity or signs of injury. Normal range of motion.     Cervical back: Normal range of motion and neck supple. No rigidity or tenderness.     Right lower leg: No edema.     Left lower leg: No edema.  Lymphadenopathy:     Cervical: No cervical adenopathy.  Skin:    General: Skin is warm and dry.     Coloration: Skin is not jaundiced or pale.     Findings: No bruising, erythema, lesion or rash.  Neurological:     Mental Status: She is alert and oriented to person, place, and time. Mental status is at baseline.     Gait: Gait normal.  Psychiatric:        Mood and Affect: Mood normal.        Behavior: Behavior normal.        Thought Content: Thought content normal.        Judgment: Judgment normal.      No results found for any visits on 06/14/23.  Assessment &  Plan    Routine Health Maintenance and Physical Exam  Exercise Activities and Dietary recommendations  Goals   None     Immunization History  Administered Date(s) Administered   Influenza Nasal 09/29/2015, 10/24/2016   Influenza Split 10/13/2013, 10/05/2014   Influenza, Seasonal, Injecte, Preservative Fre 09/27/2022   Influenza,inj,Quad PF,6+ Mos 09/29/2015, 10/24/2016, 09/28/2017, 10/20/2018, 10/26/2019, 11/14/2020, 09/26/2021   Influenza,trivalent, recombinat, inj, PF 10/13/2013   Influenza-Unspecified 10/26/2019   Janssen (J&J) SARS-COV-2 Vaccination 10/02/2019   Td 09/26/2021   Tdap 02/10/2011   Zoster Recombinant(Shingrix ) 04/06/2017, 06/08/2017    Health Maintenance  Topic Date Due    COVID-19 Vaccine (2 - 2024-25 season) 09/10/2022   Mammogram  08/09/2023   Flu Shot  08/10/2023   Pap with HPV screening  09/27/2026   Colon Cancer Screening  11/19/2029   DTaP/Tdap/Td vaccine (3 - Td or Tdap) 09/27/2031   Hepatitis C Screening  Completed   Zoster (Shingles) Vaccine  Completed   HPV Vaccine  Aged Out   Meningitis B Vaccine  Aged Out   HIV Screening  Discontinued    Discussed health benefits of physical activity, and encouraged her to engage in regular exercise appropriate for her age and condition. Assessment & Plan Knee pain Chronic Initiated physical therapy and aims to increase exercise. Continued physical activity encouraged. - Continue physical therapy. - Consider joining a gym or group activities for exercise. Continue symptomatic treatment   Prediabetes Managing through dietary modifications and exercise, preferring lifestyle changes over medication. - Continue dietary modifications and exercise. Will recheck at the follow-up  Hyperlipidemia Chronic Uncertain about current cholesterol levels, maintaining a healthy diet after consulting nutritionists. - Continue healthy diet. The 10-year ASCVD risk score (Arnett DK, et al., 2019) is: 5.1% Consider statin? Will recheck at the follow-up  Hypertension Chronic, stable Blood pressure management discussed Continue amlodipine ? Continue low salt diet and exercise as tolerated Will follow-up  Allergic rhinitis Taking medication prescribed by previous doctor, no new symptoms. - Continue current allergy medication.  General Health Maintenance Saw ophthalmologist, received new glasses. Last thyroid  ultrasound a year ago, TSH normal. Discussed benefits of aerobic exercise, particularly in water. - Schedule thyroid  ultrasound if not done recently. - Consider water aerobics at the [facility].  Follow-up Follow-up appointments recommended to monitor chronic conditions and for annual physical. - Schedule follow-up  appointment in 6 months. - Schedule annual physical in 1 year.  Annual physical exam (Primary) UTD on dental/eye Things to do to keep yourself healthy  - Exercise at least 30-45 minutes a day, 3-4 days a week.  - Eat a low-fat diet with lots of fruits and vegetables, up to 7-9 servings per day.  - Seatbelts can save your life. Wear them always.  - Smoke detectors on every level of your home, check batteries every year.  - Eye Doctor - have an eye exam every 1-2 years  - Safe sex - if you may be exposed to STDs, use a condom.  - Alcohol -  If you drink, do it moderately, less than 2 drinks per day.  - Health Care Power of Attorney. Choose someone to speak for you if you are not able.  - Depression is common in our stressful world.If you're feeling down or losing interest in things you normally enjoy, please come in for a visit.  - Violence - If anyone is threatening or hurting you, please call immediately.   Class 2 severe obesity due to excess calories with serious comorbidity and body mass index (  BMI) of 39.0 to 39.9 in adult [facility]) Chronic Weight loss of 5% of pt's current weight via healthy diet and daily exercise encouraged. Continue dietary changes and regular exercise Most recent labs showed elevated cholesterol, liver enzymes and A1c in prediabetic range Consider US  RUQ  Will follow-up    Return in about 6 months (around 12/14/2023) for chronic disease f/u in 6 mo, CPE in year.    The patient was advised to call back or seek an in-person evaluation if the symptoms worsen or if the condition fails to improve as anticipated.  I discussed the assessment and treatment plan with the patient. The patient was provided an opportunity to ask questions and all were answered. The patient agreed with the plan and demonstrated an understanding of the instructions.  I, Tyvon Eggenberger, PA-C have reviewed all documentation for this visit. The documentation on 06/14/2023  for the exam, diagnosis,  procedures, and orders are all accurate and complete.  Blane Bunting, [facility], MMS [facility] [phone] (phone) [phone] (fax)  [facility]

## 2023-06-15 ENCOUNTER — Telehealth: Payer: Self-pay

## 2023-06-17 ENCOUNTER — Encounter: Payer: Self-pay | Admitting: Physician Assistant

## 2023-06-18 ENCOUNTER — Other Ambulatory Visit: Payer: Self-pay

## 2023-06-18 MED ORDER — AMLODIPINE BESYLATE 10 MG PO TABS: 10.0000 mg | ORAL_TABLET | Freq: Every day | ORAL | 0 refills | Status: DC

## 2023-06-18 NOTE — Telephone Encounter (Signed)
 LOV 06/14/23 NOV 12/14/23 LRF 12/2022 historical provider This is a new patient to us 

## 2023-06-20 NOTE — Telephone Encounter (Signed)
 Anne Ryan

## 2023-06-27 DIAGNOSIS — M25561 Pain in right knee: Secondary | ICD-10-CM | POA: Diagnosis not present

## 2023-06-27 DIAGNOSIS — M25661 Stiffness of right knee, not elsewhere classified: Secondary | ICD-10-CM | POA: Diagnosis not present

## 2023-07-04 DIAGNOSIS — M25661 Stiffness of right knee, not elsewhere classified: Secondary | ICD-10-CM | POA: Diagnosis not present

## 2023-07-04 DIAGNOSIS — M25561 Pain in right knee: Secondary | ICD-10-CM | POA: Diagnosis not present

## 2023-07-10 DIAGNOSIS — M25561 Pain in right knee: Secondary | ICD-10-CM | POA: Diagnosis not present

## 2023-07-10 DIAGNOSIS — M25661 Stiffness of right knee, not elsewhere classified: Secondary | ICD-10-CM | POA: Diagnosis not present

## 2023-07-11 IMAGING — DX DG LUMBAR SPINE COMPLETE 4+V
5 series · 5 of 5 positions shown · non-contrast
Comparison: None.

CLINICAL DATA: Chronic low back pain

EXAM:
LUMBAR SPINE - COMPLETE 4+ VIEW

[lumbar spine ap]
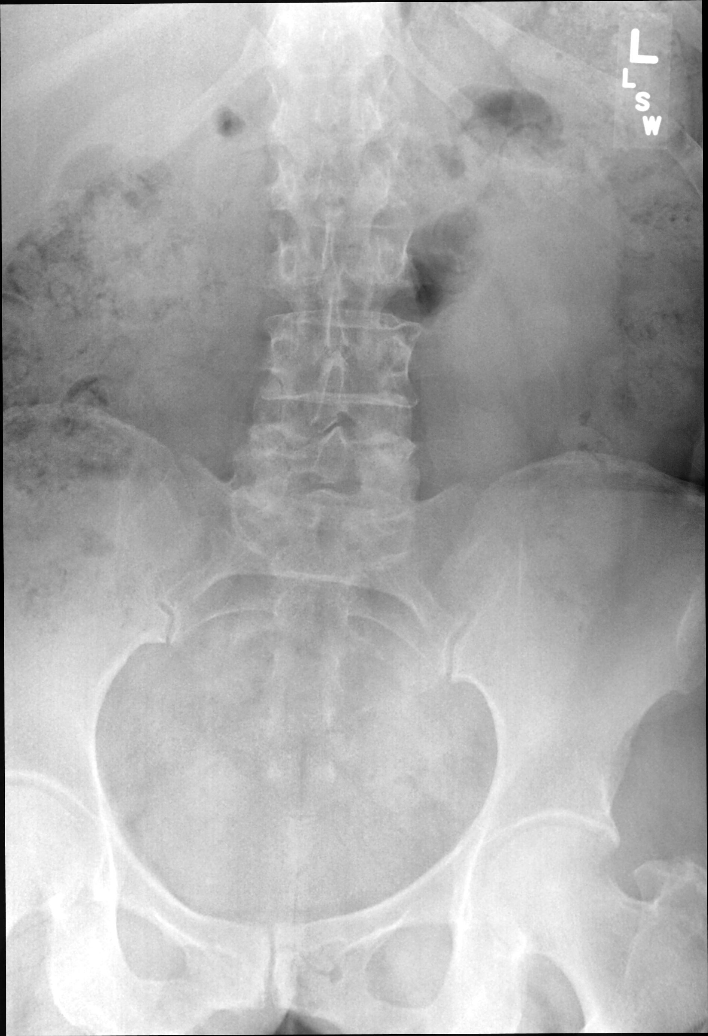

[lumbar spine obl (oblique) (1 of 2)]
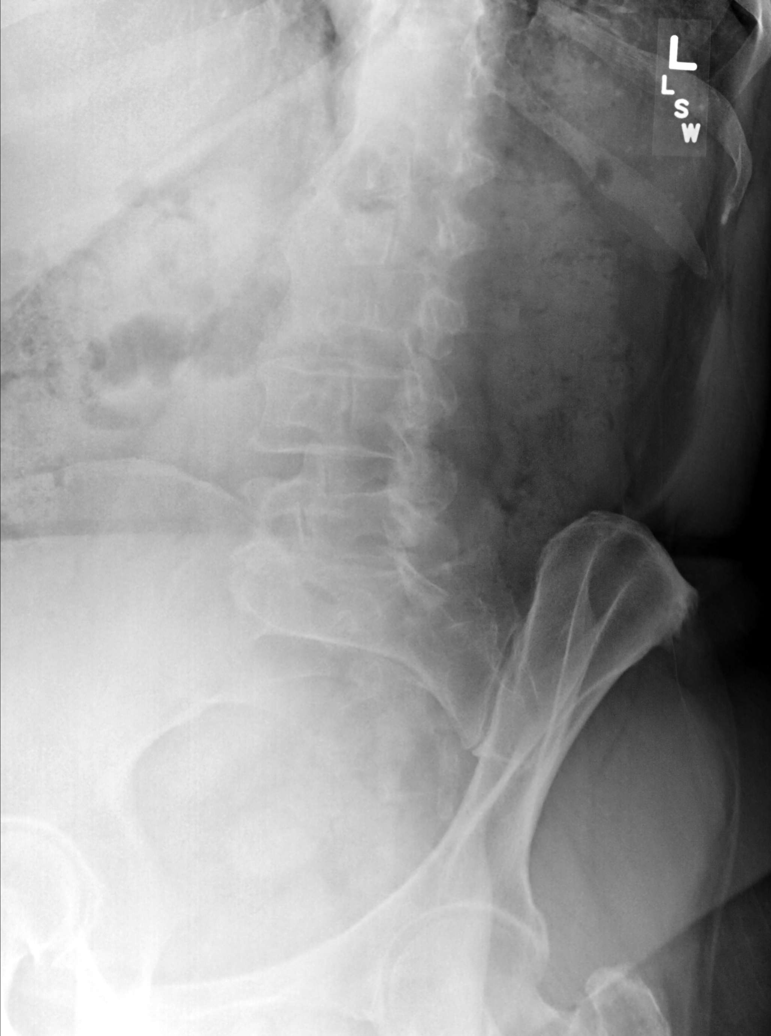

[lumbar spine obl (oblique) (2 of 2)]
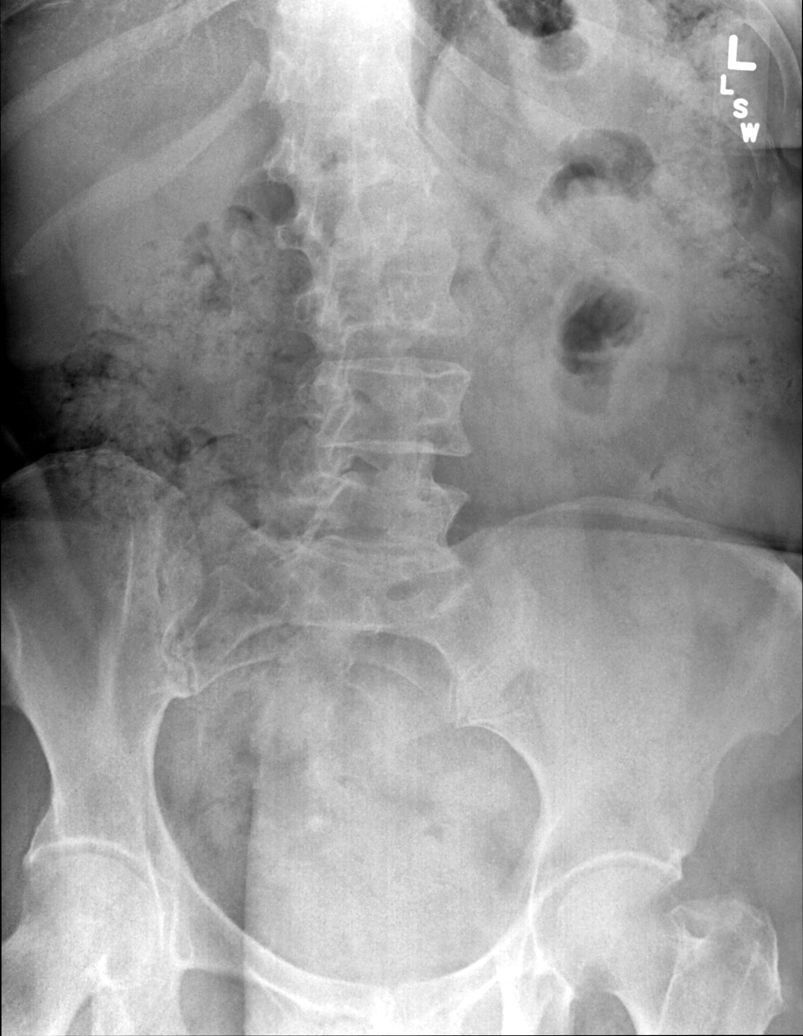

[lumbar spine lat]
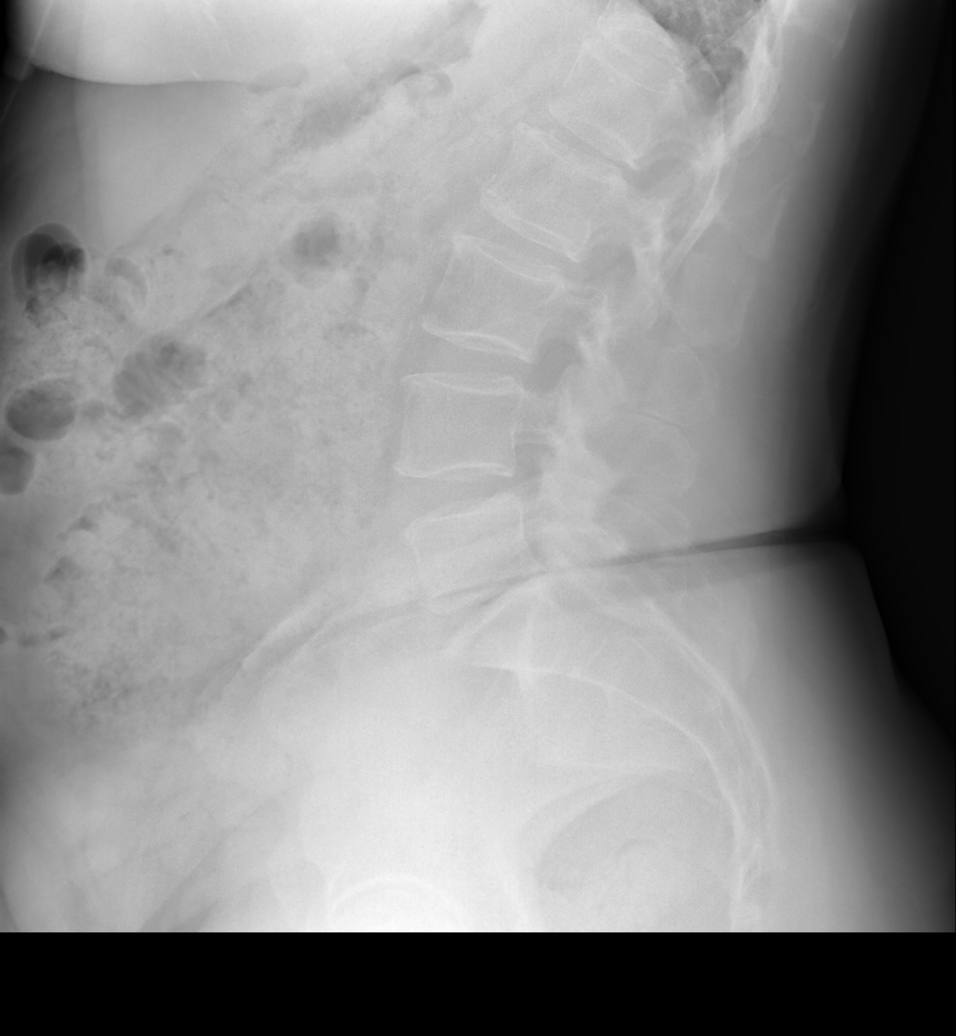

[lumbar spot lat]
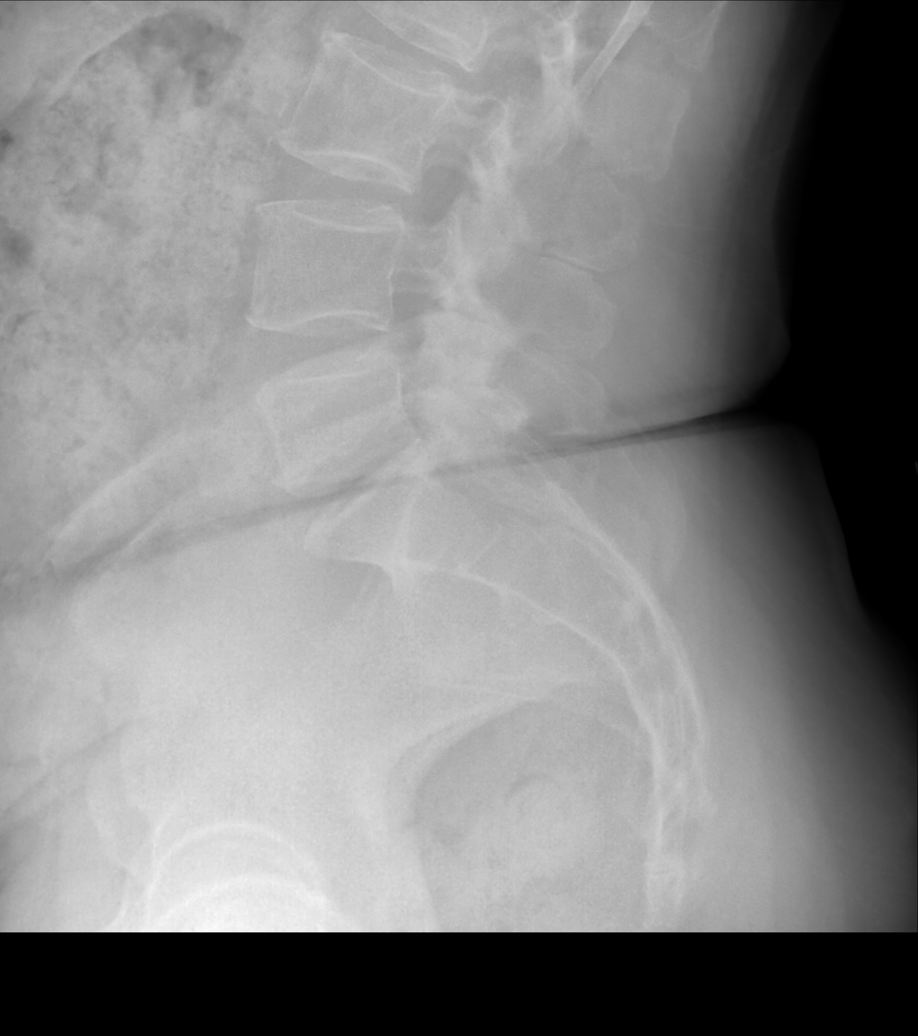

[5 of 5 positions shown; findings below may reference images not displayed]

FINDINGS: No recent fracture is seen. Alignment of posterior margins of
vertebral bodies is unremarkable. There is disc space narrowing at
the L5-S1 level along with facet hypertrophy. Degenerative changes
are noted in facet joints, more so at L4-L5 and L5-S1 levels.
Paraspinal soft tissues are unremarkable. There is mild
levoscoliosis. Kidneys are mostly obscured by bowel contents
limiting evaluation for small renal stones.
IMPRESSION: No recent fracture is seen in the lumbar spine. Degenerative changes
are noted, particularly severe at L5-S1 level with disc space
narrowing, bony spurs and facet hypertrophy.

## 2023-07-17 ENCOUNTER — Other Ambulatory Visit: Payer: Self-pay | Admitting: Physician Assistant

## 2023-07-17 DIAGNOSIS — Z1231 Encounter for screening mammogram for malignant neoplasm of breast: Secondary | ICD-10-CM

## 2023-08-10 ENCOUNTER — Ambulatory Visit
Admission: RE | Admit: 2023-08-10 | Discharge: 2023-08-10 | Disposition: A | Discharge status: Home / Self Care | Source: Ambulatory Visit | Attending: Physician Assistant | Admitting: Physician Assistant

## 2023-08-10 DIAGNOSIS — Z1231 Encounter for screening mammogram for malignant neoplasm of breast: Secondary | ICD-10-CM | POA: Diagnosis not present

## 2023-08-15 ENCOUNTER — Ambulatory Visit: Payer: Self-pay | Admitting: Physician Assistant

## 2023-10-06 DIAGNOSIS — R112 Nausea with vomiting, unspecified: Secondary | ICD-10-CM | POA: Diagnosis not present

## 2023-10-06 DIAGNOSIS — R350 Frequency of micturition: Secondary | ICD-10-CM | POA: Diagnosis not present

## 2023-10-06 DIAGNOSIS — N390 Urinary tract infection, site not specified: Secondary | ICD-10-CM | POA: Diagnosis not present

## 2023-10-17 DIAGNOSIS — M170 Bilateral primary osteoarthritis of knee: Secondary | ICD-10-CM | POA: Diagnosis not present

## 2023-11-16 ENCOUNTER — Other Ambulatory Visit: Payer: Self-pay | Admitting: Physician Assistant

## 2023-11-27 ENCOUNTER — Encounter: Payer: Self-pay | Admitting: Physician Assistant

## 2023-11-27 ENCOUNTER — Ambulatory Visit (INDEPENDENT_AMBULATORY_CARE_PROVIDER_SITE_OTHER): Admitting: Physician Assistant

## 2023-11-27 VITALS — BP 115/60 | HR 69 | Ht 67.0 in | Wt 253.1 lb

## 2023-11-27 DIAGNOSIS — M25562 Pain in left knee: Secondary | ICD-10-CM

## 2023-11-27 DIAGNOSIS — E66812 Obesity, class 2: Secondary | ICD-10-CM

## 2023-11-27 DIAGNOSIS — E7800 Pure hypercholesterolemia, unspecified: Secondary | ICD-10-CM

## 2023-11-27 DIAGNOSIS — F32A Depression, unspecified: Secondary | ICD-10-CM

## 2023-11-27 DIAGNOSIS — I10 Essential (primary) hypertension: Secondary | ICD-10-CM | POA: Diagnosis not present

## 2023-11-27 DIAGNOSIS — R748 Abnormal levels of other serum enzymes: Secondary | ICD-10-CM

## 2023-11-27 DIAGNOSIS — F419 Anxiety disorder, unspecified: Secondary | ICD-10-CM

## 2023-11-27 DIAGNOSIS — R7303 Prediabetes: Secondary | ICD-10-CM | POA: Diagnosis not present

## 2023-11-27 DIAGNOSIS — M25561 Pain in right knee: Secondary | ICD-10-CM

## 2023-11-27 DIAGNOSIS — Z23 Encounter for immunization: Secondary | ICD-10-CM

## 2023-11-27 DIAGNOSIS — G8929 Other chronic pain: Secondary | ICD-10-CM

## 2023-11-27 DIAGNOSIS — Z6839 Body mass index (BMI) 39.0-39.9, adult: Secondary | ICD-10-CM

## 2023-11-27 NOTE — Progress Notes (Unsigned)
 Established patient visit  Patient: Anne Ryan   DOB: 02/26/1960   63 y.o. Female  MRN: [id number] Visit Date: 11/27/2023  Today's healthcare provider: Jolynn Spencer, PA-C   Chief Complaint  Patient presents with   Medical Management of Chronic Issues    6 month f/u   Subjective     HPI     Medical Management of Chronic Issues    Additional comments: 6 month f/u      Last edited by Wilfred Hargis RAMAN, CMA on 11/27/2023  2:12 PM.       Discussed the use of AI scribe software for clinical note transcription with the patient, who gave verbal consent to proceed.  History of Present Illness        11/27/2023    2:18 PM 11/27/2023    2:17 PM 06/14/2023    3:40 PM  Depression screen PHQ 2/9  Decreased Interest 0 0 0  Down, Depressed, Hopeless 0 0 0  PHQ - 2 Score 0 0 0  Altered sleeping 0  0  Tired, decreased energy 1  0  Change in appetite 0  0  Feeling bad or failure about yourself  0  0  Trouble concentrating 0  0  Moving slowly or fidgety/restless 0  0  Suicidal thoughts 0  0  PHQ-9 Score 1  0   Difficult doing work/chores Not difficult at all  Not difficult at all     Data saved with a previous flowsheet row definition      06/14/2023    3:40 PM 05/04/2023    3:08 PM 09/27/2022    8:54 AM 08/29/2022   11:15 AM  GAD 7 : Generalized Anxiety Score  Nervous, Anxious, on Edge 0 0 0 2  Control/stop worrying 0 0 0 1  Worry too much - different things 0 0 0 1  Trouble relaxing 0 0 0 0  Restless 0 0 0 0  Easily annoyed or irritable 0 0 0 0  Afraid - awful might happen 0 0 0 0  Total GAD 7 Score 0 0 0 4  Anxiety Difficulty Not difficult at all Not difficult at all Not difficult at all Not difficult at all    Medications: Outpatient Medications Prior to Visit  Medication Sig   amLODipine  (NORVASC ) 10 MG tablet TAKE 1 TABLET BY MOUTH DAILY   Copper Gluconate (COPPER CAPS PO) Take by mouth.   Cyanocobalamin (VITAMIN B 12 PO) Take by mouth.   fluticasone  (FLONASE )  50 MCG/ACT nasal spray Place 2 sprays into both nostrils daily.   loratadine  (CLARITIN ) 10 MG tablet TAKE 1 TABLET BY MOUTH EVERY DAY   meloxicam (MOBIC) 15 MG tablet Take 15 mg by mouth daily.   Multiple Vitamin (THERA) TABS Take by mouth.   Omega-3 Fatty Acids (FISH OIL) 1000 MG CAPS Take by mouth.   VITAMIN D , CHOLECALCIFEROL, PO Take by mouth.   Zinc Sulfate (ZINC-220 PO) Take by mouth.   No facility-administered medications prior to visit.    Review of Systems All negative Except see HPI   {Insert previous labs (optional):23779} {See past labs  Heme  Chem  Endocrine  Serology  Results Review (optional):1}   Objective    BP 115/60   Pulse 69   Ht 5' 7 (1.702 m)   Wt 253 lb 1.6 oz (114.8 kg)   SpO2 98%   BMI 39.64 kg/m  {Insert last BP/Wt (optional):23777}{See vitals history (optional):1}   Physical Exam   No results  found for any visits on 11/27/23.      Assessment and Plan Assessment & Plan     Orders Placed This Encounter  Procedures   Flu vaccine trivalent PF, 6mos and older(Flulaval,Afluria,Fluarix,Fluzone)    No follow-ups on file.   The patient was advised to call back or seek an in-person evaluation if the symptoms worsen or if the condition fails to improve as anticipated.  I discussed the assessment and treatment plan with the patient. The patient was provided an opportunity to ask questions and all were answered. The patient agreed with the plan and demonstrated an understanding of the instructions.  I, Ilona Colley, PA-C have reviewed all documentation for this visit. The documentation on 11/27/2023  for the exam, diagnosis, procedures, and orders are all accurate and complete.  Jolynn Spencer, [facility], MMS [facility] [phone] (phone) [phone] (fax)  [facility]

## 2023-11-28 DIAGNOSIS — R748 Abnormal levels of other serum enzymes: Secondary | ICD-10-CM | POA: Insufficient documentation

## 2023-11-28 DIAGNOSIS — E7800 Pure hypercholesterolemia, unspecified: Secondary | ICD-10-CM | POA: Diagnosis not present

## 2023-11-28 DIAGNOSIS — G8929 Other chronic pain: Secondary | ICD-10-CM | POA: Insufficient documentation

## 2023-11-28 DIAGNOSIS — I10 Essential (primary) hypertension: Secondary | ICD-10-CM | POA: Diagnosis not present

## 2023-11-28 DIAGNOSIS — R7303 Prediabetes: Secondary | ICD-10-CM | POA: Diagnosis not present

## 2023-11-29 LAB — COMPREHENSIVE METABOLIC PANEL WITH GFR
ALT: 27 IU/L (ref 0–32)
AST: 24 IU/L (ref 0–40)
Albumin: 4.5 g/dL (ref 3.9–4.9)
Alkaline Phosphatase: 77 IU/L (ref 49–135)
BUN/Creatinine Ratio: 28 (ref 12–28)
BUN: 24 mg/dL (ref 8–27)
Bilirubin Total: 0.8 mg/dL (ref 0.0–1.2)
CO2: 23 mmol/L (ref 20–29)
Calcium: 9.6 mg/dL (ref 8.7–10.3)
Chloride: 104 mmol/L (ref 96–106)
Creatinine, Ser: 0.86 mg/dL (ref 0.57–1.00)
Globulin, Total: 2.5 g/dL (ref 1.5–4.5)
Glucose: 88 mg/dL (ref 70–99)
Potassium: 4.2 mmol/L (ref 3.5–5.2)
Sodium: 141 mmol/L (ref 134–144)
Total Protein: 7 g/dL (ref 6.0–8.5)
eGFR: 77 mL/min/1.73 (ref >59)

## 2023-11-29 LAB — HEMOGLOBIN A1C
Est. average glucose Bld gHb Est-mCnc: 111 mg/dL
Hgb A1c MFr Bld: 5.5 % (ref 4.8–5.6)

## 2023-11-29 LAB — LIPID PANEL
Chol/HDL Ratio: 4 ratio (ref 0.0–4.4)
Cholesterol, Total: 203 mg/dL — ABNORMAL HIGH (ref 100–199)
HDL: 51 mg/dL (ref >39)
LDL Chol Calc (NIH): 134 mg/dL — ABNORMAL HIGH (ref 0–99)
Triglycerides: 99 mg/dL (ref 0–149)
VLDL Cholesterol Cal: 18 mg/dL (ref 5–40)

## 2023-12-03 ENCOUNTER — Ambulatory Visit: Payer: Self-pay | Admitting: Physician Assistant

## 2023-12-03 NOTE — Progress Notes (Signed)
 If patient agrees, please send a prescription for rosuvastatin  5 mg #90 refill 0, take 1 tablets by mouth at bedtime

## 2023-12-04 MED ORDER — ROSUVASTATIN CALCIUM 5 MG PO TABS: 5.0000 mg | ORAL_TABLET | Freq: Every day | ORAL | 0 refills | Status: DC

## 2023-12-04 NOTE — Telephone Encounter (Signed)
 Last read by Dickey Bash at 9:48AM on 12/04/2023. , pt was agreeable to medication.

## 2023-12-05 DIAGNOSIS — F4323 Adjustment disorder with mixed anxiety and depressed mood: Secondary | ICD-10-CM | POA: Diagnosis not present

## 2023-12-05 DIAGNOSIS — F331 Major depressive disorder, recurrent, moderate: Secondary | ICD-10-CM | POA: Diagnosis not present

## 2023-12-12 DIAGNOSIS — F331 Major depressive disorder, recurrent, moderate: Secondary | ICD-10-CM | POA: Diagnosis not present

## 2023-12-12 DIAGNOSIS — F4323 Adjustment disorder with mixed anxiety and depressed mood: Secondary | ICD-10-CM | POA: Diagnosis not present

## 2023-12-14 ENCOUNTER — Ambulatory Visit: Admitting: Physician Assistant

## 2023-12-16 DIAGNOSIS — M170 Bilateral primary osteoarthritis of knee: Secondary | ICD-10-CM | POA: Diagnosis not present

## 2023-12-19 DIAGNOSIS — F331 Major depressive disorder, recurrent, moderate: Secondary | ICD-10-CM | POA: Diagnosis not present

## 2023-12-19 DIAGNOSIS — F4323 Adjustment disorder with mixed anxiety and depressed mood: Secondary | ICD-10-CM | POA: Diagnosis not present

## 2023-12-26 DIAGNOSIS — F4323 Adjustment disorder with mixed anxiety and depressed mood: Secondary | ICD-10-CM | POA: Diagnosis not present

## 2023-12-26 DIAGNOSIS — F331 Major depressive disorder, recurrent, moderate: Secondary | ICD-10-CM | POA: Diagnosis not present

## 2023-12-31 DIAGNOSIS — F331 Major depressive disorder, recurrent, moderate: Secondary | ICD-10-CM | POA: Diagnosis not present

## 2023-12-31 DIAGNOSIS — F4323 Adjustment disorder with mixed anxiety and depressed mood: Secondary | ICD-10-CM | POA: Diagnosis not present

## 2024-02-05 ENCOUNTER — Other Ambulatory Visit: Payer: Self-pay

## 2024-02-05 ENCOUNTER — Telehealth: Payer: Self-pay | Admitting: Physician Assistant

## 2024-02-05 MED ORDER — ROSUVASTATIN CALCIUM 5 MG PO TABS: 5.0000 mg | ORAL_TABLET | Freq: Every day | ORAL | 0 refills | Status: AC

## 2024-02-05 MED ORDER — AMLODIPINE BESYLATE 10 MG PO TABS: 10.0000 mg | ORAL_TABLET | Freq: Every day | ORAL | 0 refills | Status: AC

## 2024-02-05 NOTE — Telephone Encounter (Signed)
 Converted

## 2024-02-05 NOTE — Telephone Encounter (Signed)
 [facility] faxed refill request for the following medications:   amLODipine  (NORVASC ) 10 MG tablet    rosuvastatin  (CRESTOR ) 5 MG tablet    Please advise.

## 2024-06-16 ENCOUNTER — Encounter: Admitting: Physician Assistant
